# Patient Record
Sex: Female | Born: 1990 | Race: White | Hispanic: No | Marital: Married | Smoking: Never smoker
Health system: Southern US, Community
[De-identification: ages and names within clinical notes are randomized; demographics above are authoritative.]

## PROBLEM LIST (undated history)

## (undated) DIAGNOSIS — R87629 Unspecified abnormal cytological findings in specimens from vagina: Secondary | ICD-10-CM

## (undated) DIAGNOSIS — B009 Herpesviral infection, unspecified: Secondary | ICD-10-CM

## (undated) DIAGNOSIS — Z789 Other specified health status: Secondary | ICD-10-CM

## (undated) DIAGNOSIS — F909 Attention-deficit hyperactivity disorder, unspecified type: Secondary | ICD-10-CM

## (undated) HISTORY — DX: Attention-deficit hyperactivity disorder, unspecified type: F90.9

## (undated) HISTORY — DX: Unspecified abnormal cytological findings in specimens from vagina: R87.629

## (undated) HISTORY — PX: WISDOM TOOTH EXTRACTION: SHX21

## (undated) HISTORY — DX: Herpesviral infection, unspecified: B00.9

---

## 2014-02-28 DIAGNOSIS — F902 Attention-deficit hyperactivity disorder, combined type: Secondary | ICD-10-CM | POA: Insufficient documentation

## 2015-03-14 ENCOUNTER — Encounter (HOSPITAL_COMMUNITY): Payer: Self-pay | Admitting: Obstetrics and Gynecology

## 2015-03-14 ENCOUNTER — Other Ambulatory Visit (HOSPITAL_COMMUNITY): Payer: Self-pay | Admitting: Obstetrics and Gynecology

## 2015-03-14 DIAGNOSIS — Z3A18 18 weeks gestation of pregnancy: Secondary | ICD-10-CM

## 2015-03-14 DIAGNOSIS — Z3689 Encounter for other specified antenatal screening: Secondary | ICD-10-CM

## 2015-03-14 DIAGNOSIS — O358XX0 Maternal care for other (suspected) fetal abnormality and damage, not applicable or unspecified: Secondary | ICD-10-CM

## 2015-03-17 ENCOUNTER — Encounter (HOSPITAL_COMMUNITY): Payer: Self-pay | Admitting: Obstetrics and Gynecology

## 2015-03-28 ENCOUNTER — Ambulatory Visit (HOSPITAL_COMMUNITY): Admission: RE | Admit: 2015-03-28 | Payer: BLUE CROSS/BLUE SHIELD | Source: Ambulatory Visit

## 2015-03-28 ENCOUNTER — Encounter (HOSPITAL_COMMUNITY): Payer: BLUE CROSS/BLUE SHIELD

## 2015-03-31 ENCOUNTER — Ambulatory Visit (HOSPITAL_COMMUNITY): Admission: RE | Admit: 2015-03-31 | Payer: BLUE CROSS/BLUE SHIELD | Source: Ambulatory Visit

## 2015-03-31 ENCOUNTER — Ambulatory Visit (HOSPITAL_COMMUNITY)
Admission: RE | Admit: 2015-03-31 | Discharge: 2015-03-31 | Disposition: A | Discharge status: Home / Self Care | Payer: BLUE CROSS/BLUE SHIELD | Source: Ambulatory Visit | Attending: Obstetrics and Gynecology | Admitting: Obstetrics and Gynecology

## 2015-03-31 ENCOUNTER — Encounter (HOSPITAL_COMMUNITY): Payer: Self-pay

## 2015-03-31 DIAGNOSIS — O358XX0 Maternal care for other (suspected) fetal abnormality and damage, not applicable or unspecified: Secondary | ICD-10-CM | POA: Insufficient documentation

## 2015-03-31 DIAGNOSIS — Z36 Encounter for antenatal screening of mother: Secondary | ICD-10-CM | POA: Insufficient documentation

## 2015-03-31 DIAGNOSIS — Z3689 Encounter for other specified antenatal screening: Secondary | ICD-10-CM

## 2015-03-31 DIAGNOSIS — Z3A18 18 weeks gestation of pregnancy: Secondary | ICD-10-CM

## 2015-03-31 HISTORY — DX: Other specified health status: Z78.9

## 2015-04-07 ENCOUNTER — Encounter (HOSPITAL_COMMUNITY): Payer: Self-pay

## 2015-04-07 ENCOUNTER — Other Ambulatory Visit (HOSPITAL_COMMUNITY): Payer: Self-pay

## 2015-08-21 DIAGNOSIS — Q793 Gastroschisis: Secondary | ICD-10-CM

## 2016-02-03 ENCOUNTER — Encounter (HOSPITAL_COMMUNITY): Payer: Self-pay

## 2017-03-03 IMAGING — US US MFM OB DETAIL+14 WK
1 series · 14 of 28 positions shown · non-contrast
Comparison: none

[Series 1: us mfm ob detail+14 wk · 116 acquisitions, 14 frames shown]
[im 5/116]
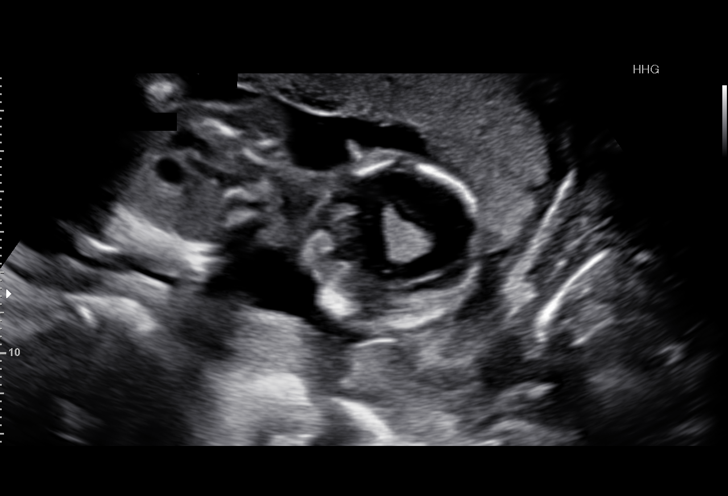
[im 13/116]
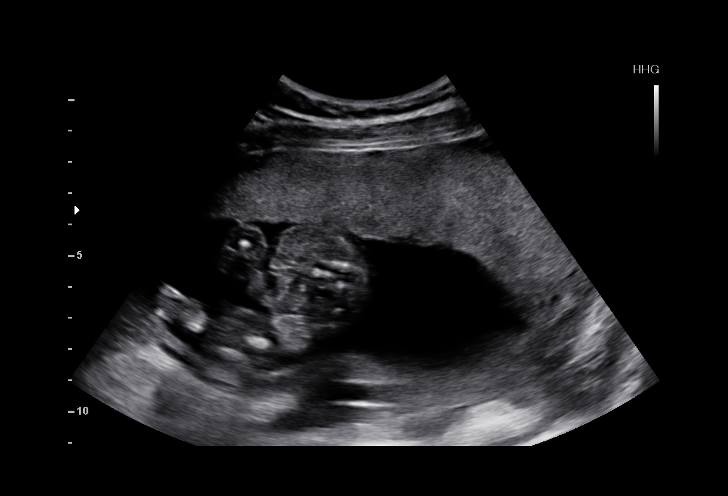
[im 22/116]
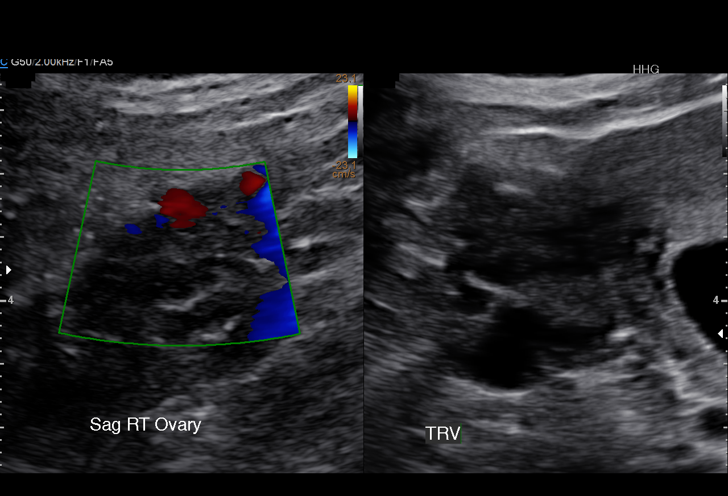
[im 30/116]
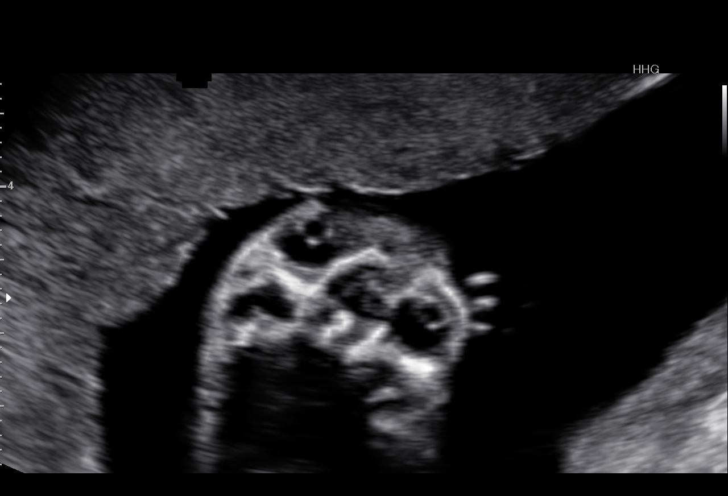
[im 39/116]
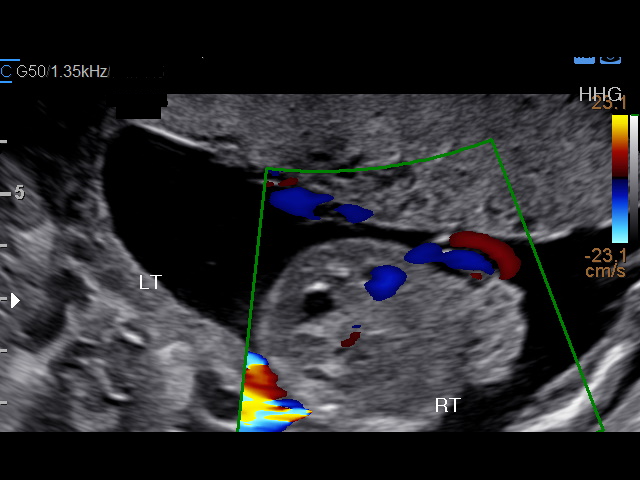
[im 47/116]
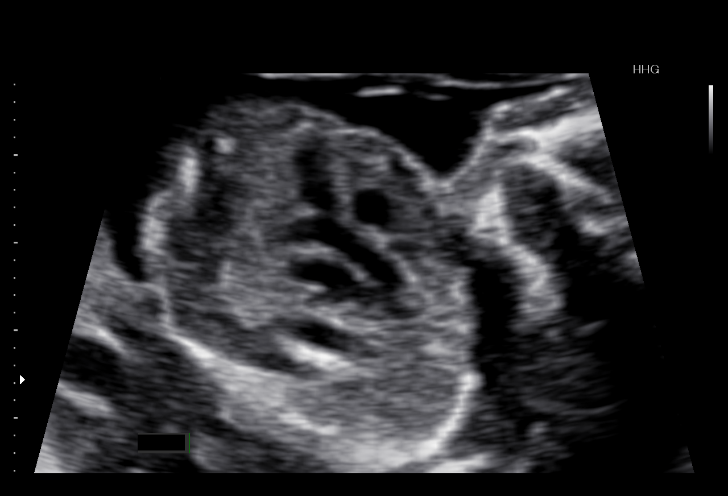
[im 56/116]
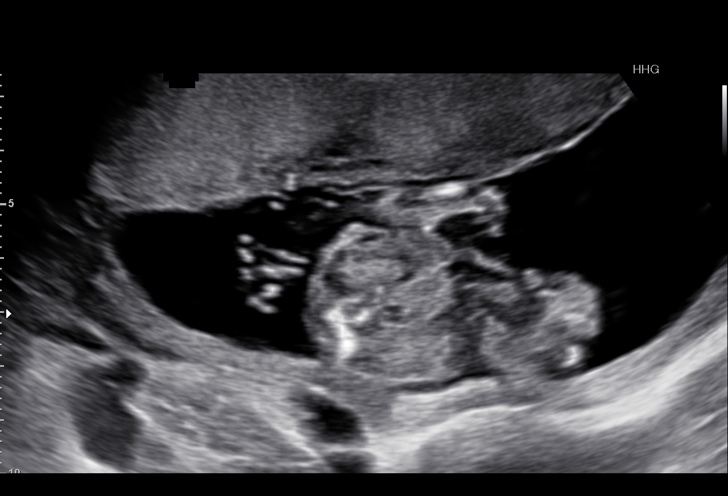
[im 64/116]
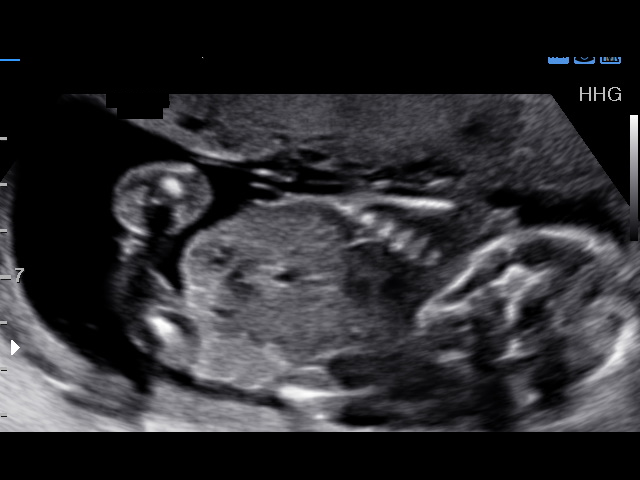
[im 73/116]
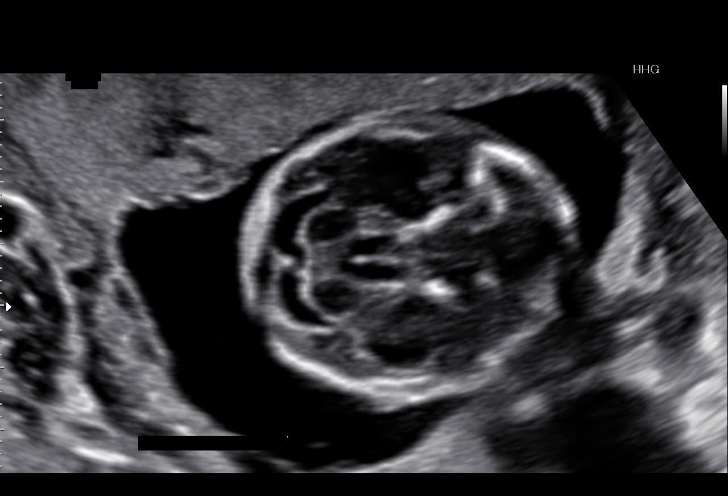
[im 81/116]
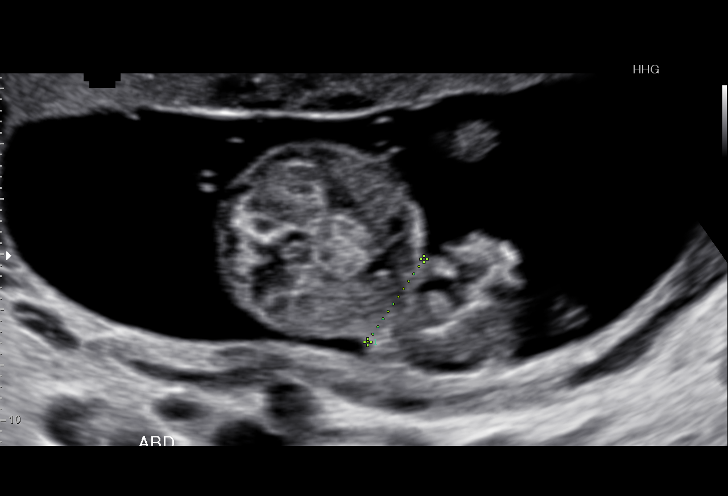
[im 90/116]
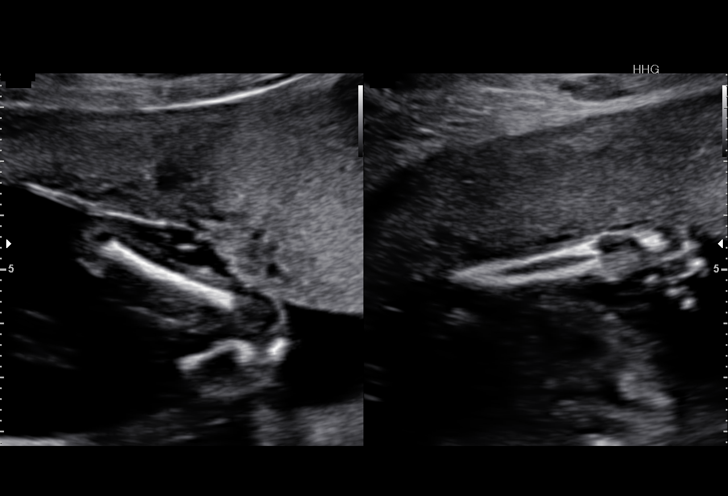
[im 98/116]
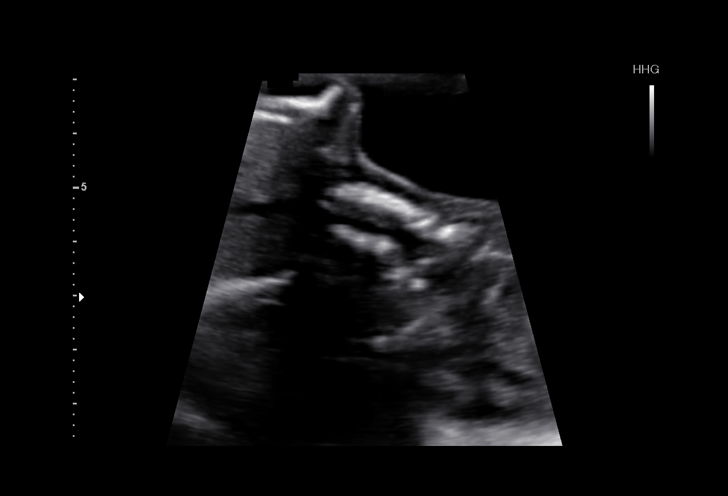
[im 107/116]
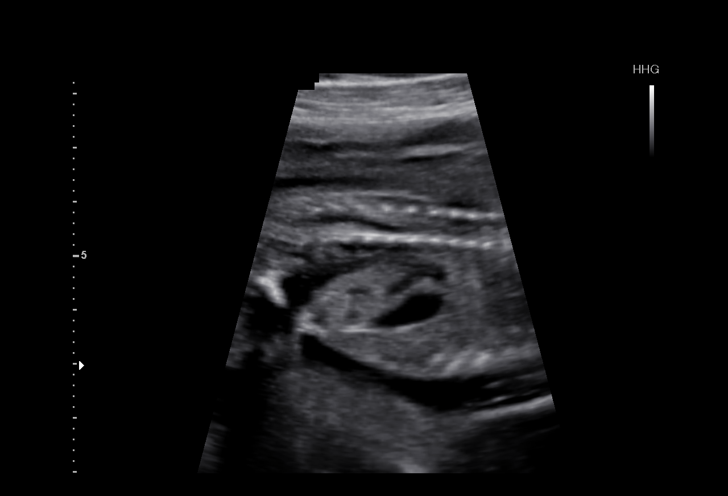
[im 116/116]
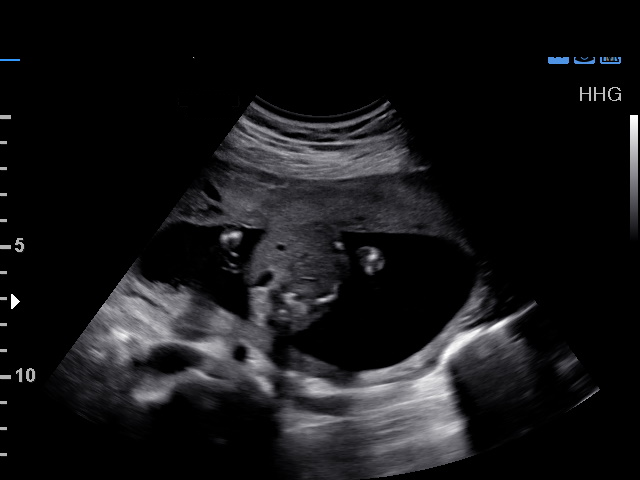

[14 of 28 positions shown; findings below may reference images not displayed]

[[facility]

1  DASHAWN SCHMID           [id number]      [id number]     [id number]
Indications

Detailed fetal anatomic survey                 Z36
Fetal abnormality - other known or
suspected (gastroschisis)
18 weeks gestation of pregnancy
OB History

Gravidity:    1
Fetal Evaluation

Num Of Fetuses:     1
Fetal Heart         136
Rate(bpm):
Cardiac Activity:   Observed
Presentation:       Cephalic
Placenta:           Anterior, above cervical os
P. Cord Insertion:  Visualized, central

Amniotic Fluid
AFI FV:      Subjectively within normal limits
Larg Pckt:   4.61  cm
Biometry

BPD:        43  mm     G. Age:  19w 0d                  CI:         73.07  %    70 - 86
FL/HC:       18.3  %    16.1 -
HC:      159.9  mm     G. Age:  18w 6d         41  %    HC/AC:       1.19       1.09 -
AC:      134.7  mm     G. Age:  19w 0d         49  %    FL/BPD:      67.9  %
FL:       29.2  mm     G. Age:  19w 0d         48  %    FL/AC:       21.7  %    20 - 24
HUM:        27  mm     G. Age:  18w 4d         44  %
CER:      18.5  mm     G. Age:  18w 2d         31  %
NFT:       3.4  mm
LV:        6.9  mm
CM:        3.7  mm

Est. FW:     266   gm     0 lb 9 oz     47  %
Gestational Age

LMP:           20w 4d        Date:  11/07/14                 EDD:    08/14/15
U/S Today:     19w 0d                                        EDD:    08/25/15
Best:          18w 6d     Det. By:  Previous Ultrasound      EDD:    08/26/15
Anatomy

Cranium:          Appears normal         Aortic Arch:      Appears normal
Fetal Cavum:      Appears normal         Ductal Arch:      Appears normal
Ventricles:       Appears normal         Diaphragm:        Appears normal
Choroid Plexus:   Appears normal         Stomach:          Appears normal, left
sided
Cerebellum:       Appears normal         Abdomen:          Appears normal
Posterior Fossa:  Appears normal         Abdominal Wall:   Gastroschisis
Nuchal Fold:      Appears normal         Cord Vessels:     Appears normal (3
vessel cord)
Face:             Appears normal         Kidneys:          Appear normal
(orbits and profile)
Lips:             Appears normal         Bladder:          Appears normal
Fetal Thoracic:   Appears normal         Spine:            Appears normal
Heart:            Appears normal         Upper             Appears normal
(4CH, axis, and situs  Extremities:
RVOT:             Appears normal         Lower             Appears normal
Extremities:
LVOT:             Appears normal

Other:  Fetus appears to be a female. Nasal bone visualized. Technically
difficult due to fetal position.
Cervix Uterus Adnexa

Cervix
Length:           3.02  cm.
Normal appearance by transabdominal scan.

Uterus
No abnormality visualized.

Left Ovary
Not visualized.

Right Ovary
Size(cm)     2.13   x   1.98   x  1.52      Vol(ml):
Within normal limits.

Adnexa:       No adnexal mass visualized.
Impression

SIUP at 18+6 weeks
Gastroschisis
All other detailed fetal anatomy was seen and appeared
normal
Markers of aneuploidy: none
Normal amniotic fluid volume
Measurements consistent with prior US

The US findings were shared with Ms. Jumper. The
implications of gastroschisis were discussed in detail. Ms.
Jumper indicated that she wanted to deliver at [facility] to be
near her family. I will contact [facility] perinatal coordinator
who can arrange future appointments.
Recommendations

Serial [employer] for growth
Vaginal delivery by 39 weeks depending on clinical course
Prenatal consultation with pediatric general surgeon

## 2020-11-25 ENCOUNTER — Ambulatory Visit: Payer: Self-pay

## 2020-12-20 ENCOUNTER — Other Ambulatory Visit: Payer: Self-pay | Admitting: Obstetrics & Gynecology

## 2020-12-20 DIAGNOSIS — O3680X0 Pregnancy with inconclusive fetal viability, not applicable or unspecified: Secondary | ICD-10-CM

## 2020-12-26 ENCOUNTER — Other Ambulatory Visit: Payer: Self-pay

## 2020-12-26 ENCOUNTER — Ambulatory Visit (INDEPENDENT_AMBULATORY_CARE_PROVIDER_SITE_OTHER): Payer: No Typology Code available for payment source

## 2020-12-26 ENCOUNTER — Other Ambulatory Visit: Payer: Self-pay | Admitting: Obstetrics & Gynecology

## 2020-12-26 DIAGNOSIS — Z3A09 9 weeks gestation of pregnancy: Secondary | ICD-10-CM

## 2020-12-26 DIAGNOSIS — O3680X0 Pregnancy with inconclusive fetal viability, not applicable or unspecified: Secondary | ICD-10-CM

## 2020-12-26 NOTE — Progress Notes (Signed)
[place] 9+3 wks,DC/DA twins,normal ovaries Baby A: left,anterior placenta,CRL 28.48 mm,FHR 167 bpm Baby B: right,posterior placenta,CRL 28.27 mm,FHR 158 bpm

## 2021-01-24 ENCOUNTER — Other Ambulatory Visit: Payer: Self-pay | Admitting: Obstetrics & Gynecology

## 2021-01-24 DIAGNOSIS — Z3682 Encounter for antenatal screening for nuchal translucency: Secondary | ICD-10-CM

## 2021-01-24 DIAGNOSIS — Z3689 Encounter for other specified antenatal screening: Secondary | ICD-10-CM

## 2021-01-25 ENCOUNTER — Ambulatory Visit: Payer: No Typology Code available for payment source | Admitting: *Deleted

## 2021-01-25 ENCOUNTER — Encounter: Payer: Self-pay | Admitting: Advanced Practice Midwife

## 2021-01-25 ENCOUNTER — Other Ambulatory Visit: Payer: Self-pay

## 2021-01-25 ENCOUNTER — Other Ambulatory Visit: Payer: Self-pay | Admitting: Advanced Practice Midwife

## 2021-01-25 ENCOUNTER — Ambulatory Visit (INDEPENDENT_AMBULATORY_CARE_PROVIDER_SITE_OTHER): Payer: No Typology Code available for payment source

## 2021-01-25 ENCOUNTER — Ambulatory Visit (INDEPENDENT_AMBULATORY_CARE_PROVIDER_SITE_OTHER): Payer: No Typology Code available for payment source | Admitting: Advanced Practice Midwife

## 2021-01-25 VITALS — BP 113/67 | HR 72 | Ht 67.0 in | Wt 135.0 lb

## 2021-01-25 DIAGNOSIS — O30041 Twin pregnancy, dichorionic/diamniotic, first trimester: Secondary | ICD-10-CM

## 2021-01-25 DIAGNOSIS — N83202 Unspecified ovarian cyst, left side: Secondary | ICD-10-CM

## 2021-01-25 DIAGNOSIS — Z3682 Encounter for antenatal screening for nuchal translucency: Secondary | ICD-10-CM | POA: Diagnosis not present

## 2021-01-25 DIAGNOSIS — N83201 Unspecified ovarian cyst, right side: Secondary | ICD-10-CM | POA: Diagnosis not present

## 2021-01-25 DIAGNOSIS — O30049 Twin pregnancy, dichorionic/diamniotic, unspecified trimester: Secondary | ICD-10-CM | POA: Insufficient documentation

## 2021-01-25 DIAGNOSIS — O0991 Supervision of high risk pregnancy, unspecified, first trimester: Secondary | ICD-10-CM

## 2021-01-25 DIAGNOSIS — O0992 Supervision of high risk pregnancy, unspecified, second trimester: Secondary | ICD-10-CM

## 2021-01-25 DIAGNOSIS — Z6791 Unspecified blood type, Rh negative: Secondary | ICD-10-CM

## 2021-01-25 DIAGNOSIS — Z363 Encounter for antenatal screening for malformations: Secondary | ICD-10-CM

## 2021-01-25 DIAGNOSIS — Z3689 Encounter for other specified antenatal screening: Secondary | ICD-10-CM

## 2021-01-25 DIAGNOSIS — O30009 Twin pregnancy, unspecified number of placenta and unspecified number of amniotic sacs, unspecified trimester: Secondary | ICD-10-CM

## 2021-01-25 DIAGNOSIS — O26899 Other specified pregnancy related conditions, unspecified trimester: Secondary | ICD-10-CM | POA: Diagnosis not present

## 2021-01-25 DIAGNOSIS — Z3A13 13 weeks gestation of pregnancy: Secondary | ICD-10-CM | POA: Diagnosis not present

## 2021-01-25 DIAGNOSIS — O0990 Supervision of high risk pregnancy, unspecified, unspecified trimester: Secondary | ICD-10-CM | POA: Insufficient documentation

## 2021-01-25 LAB — POCT URINALYSIS DIPSTICK OB
Blood, UA: NEGATIVE
Glucose, UA: NEGATIVE
Ketones, UA: NEGATIVE
Leukocytes, UA: NEGATIVE
Nitrite, UA: NEGATIVE
POC,PROTEIN,UA: NEGATIVE

## 2021-01-25 NOTE — Patient Instructions (Addendum)
Tami Velasquez, I greatly value your feedback.  If you receive a survey following your visit with [place] today, we appreciate you taking the time to fill it out.  Thanks, Cathie Beams, DNP, CNM   [phone] is the phone number for Pregnancy Classes or hospital tours at [facility].   You will be referred to  [email or web address]   for more information on childbirth classes   At this site you may register for classes. You may sign up for a waiting list if classes are full. Please SIGN UP FOR THIS!.   When the waiting list becomes long, sometimes new classes can be added.  [facility] Call to Register: [phone] or [phone]   or   Register Online: [email or web address] THESE CLASSES FILL UP VERY QUICKLY, SO SIGN UP AS SOON AS YOU CAN!!! Please visit [facility] pregnancy website at [email or web address]  Childbirth Classes  Option 1: Birth & Baby Series Series of 3 weekly classes, on the same day of the week (can choose Mon-Thurs) from 6-9pm Helps you and your support person prepare for childbirth Reviews newborn care, labor & birth, cesarean birth, pain management, and comfort techniques Cost: $60 per couple for insured or self-pay, $30 per couple for Medicaid  Option 2: Weekend Birth & Baby This class is a weekend version of our Birth & Baby series.  It is designed for parents who have a difficult time fitting several weeks of classes into their schedule.   Covers the care of your newborn and the basics of labor and childbirth Friday 6:30pm-8:30pm Saturday 9am-4pm, includes lunch for you and your partner  Cost: $75 per couple for insured or self-pay, $30 per couple for Medicaid  Option 3: Natural Childbirth This series of 5 weekly classes is for expectant parents who want to learn and practice natural methods of coping with the process of labor  and childbirth.  Can choose Mon or Tues, 7-9pm.   Covers relaxation, breathing, massage, visualization, role of the partner, and helpful positioning Participants learn how to be confident in their body's ability to give birth. Class empowers and helps parents make informed decisions about care. Includes discussion that will help new parents transition into the immediate postpartum period.  Cost: $75 per couple for insured or self-pay, $30 per couple for Medicaid  Option 4: Online Birth & Baby This online class offers you the freedom to complete a Birth & Baby series in the comfort of your own home.  The flexibility of this option allows you to review sections at your own pace, at times convenient to you and your support people.  It includes additional video information, animations, quizzes and extended activities. Get organized with helpful eClass tools, checklists, and trackers.  Cost: $60 for 60 days of online access                                                                            Other Available Classes  Baby & Me Enjoy this time to discuss newborn & infant parenting topics and family adjustment issues with other new mothers in a relaxed environment. Each week brings a new speaker or baby-centered activity. We encourage mothers and their babies (birth to  crawling) to join [place]. You are welcome to visit this group even if you haven't delivered yet! It's wonderful to make new friends early and watch other moms interact with their babies. No registration or fee.  Big Brother/Big Sister Let your children share in the joy of a new brother or sister in this special class designed just for them. Discussion includes how families care for babies: swaddling, holding, diapering, safety, as well as how they can be helpful in their new role. This class is designed for children ages 2 to 25, but any age is welcome. Please register each child individually. $5 Breastfeeding Support Group This group is a  mother-to-mother support circle where moms have the opportunity to share their breastfeeding experiences. A Breastfeeding Support nurse is present for questions and concerns. An infant scale is available for weight checks. No fee or registration.  Breastfeeding Your Baby Breastfeeding is a special time for mother and child. This class will help you feel ready to begin this important relationship. Your partner is encouraged to attend with you. Learn what to expect and feel more confident in the first days of breastfeeding your newborn. This class also addresses the most common fears and challenges of breastfeeding during the first few weeks, months, and beyond. $30 per couple Caring for Baby This class is for expectant and adoptive parents who want to learn and practice the most up-to-date newborn care for their babies. Focus is on birth through first six weeks of life. Topics include feeding, bathing, diapering, crying, umbilical cord care, circumcision care and safe sleep. Parents learn how to recognize symptoms of illness and when to call the pediatrician. Register only the mom-to-be and your partner can plan to come with you. (*Note: This class is included in the Birth & Baby series and the Weekend Birth & Baby classes.) $10 per couple Comfort Techniques & Tour This 2-hour interactive class is designed for those who either do not wish to take the Birth & Baby series or for those who prefer our online childbirth class, but don't want to miss the opportunity to learn and practice hands-on techniques. These skills can help relieve some of the discomfort of labor and encourage your baby to rotate toward the best position for birth. You and your partner will be able to try a variety of labor positions with birth balls and rebozos as well as practice breathing, relaxation, and visual techniques. $20 per couple [employer] This course offers Dads-to-be the tools and knowledge needed to feel confident on  their journey to becoming new fathers. Experienced dads, who have been trained as coaches, teach dads-to-be how to hold, comfort, diapers, swaddle and play with their infant while being able to support the new mom as well. $25 Grandparent Love Expecting a grandbaby? Learn about the latest infant care and safety recommendations and ways to support your own child as he or she transitions into the parenting role. $10 per person Infant and Child CPR Parents, grandparents, babysitters, and friends learn Cardio-Pulmonary Resuscitation skills for infants and children. You will also learn how to treat both conscious and unconscious choking infants and children. Register each participant individually. (Note: This Family & Friends program does not offer certification.) $20 per person Marvelous Multiples Expecting twins, triplets, or more? This free 2-hour class covers the differences in labor, birth, parenting, and breastfeeding issues that face multiples' parents.  Maternity Care Center Virtual Tour  Online virtual tour of the [place] [facility] [place]  [facility]  Mom Talk This free mom-led group offers support and connection to mothers as they journey through the adjustments and struggles of that sometimes overwhelming first year after the birth of a child. A member of our staff will be present to share resources and additional support if needed, as you care for yourself and baby. You are welcome to visit this group before you deliver! It's wonderful to meet new friends early and watch other moms interact with their babies.  Waterbirth Class Interested in a waterbirth? This free informational class will help you discover whether waterbirth is the right fit for you and is required if you are planning a waterbirth. Education about waterbirth itself, supplies you may need, and what you may need from your support team is included in this class. Partners are encouraged to come.     [facility] HAS MOVED!!! It is now [facility] ([address] [place], [place] [address]) Entrance located off of [address] Free 24/7 valet parking   Nausea & Vomiting Have saltine crackers or pretzels by your bed and eat a few bites before you raise your head out of bed in the morning Eat small frequent meals throughout the day instead of large meals Drink plenty of fluids throughout the day to stay hydrated, just don't drink a lot of fluids with your meals.  This can make your stomach fill up faster making you feel sick Do not brush your teeth right after you eat Products with real ginger are good for nausea, like ginger ale and ginger hard candy Make sure it says made with real ginger! Sucking on sour candy like lemon heads is also good for nausea If your prenatal vitamins make you nauseated, take them at night so you will sleep through the nausea Sea Bands If you feel like you need medicine for the nausea & vomiting please let us know If you are unable to keep any fluids or food down please let us know   Constipation Drink plenty of fluid, preferably water, throughout the day Eat foods high in fiber such as fruits, vegetables, and grains Exercise, such as walking, is a good way to keep your bowels regular Drink warm fluids, especially warm prune juice, or decaf coffee Eat a 1/2 cup of real oatmeal (not instant), 1/2 cup applesauce, and 1/2-1 cup warm prune juice every day If needed, you may take Colace (docusate sodium) stool softener once or twice a day to help keep the stool soft.  If you still are having problems with constipation, you may take Miralax once daily as needed to help keep your bowels regular.   Home Blood Pressure Monitoring for Patients   Your provider has recommended that you check your blood pressure (BP) at least once a week at home. If you do not have a blood pressure cuff at home, one will be provided for you. Contact your provider  if you have not received your monitor within 1 week.   Helpful Tips for Accurate Home Blood Pressure Checks  Don't smoke, exercise, or drink caffeine 30 minutes before checking your BP Use the restroom before checking your BP (a full bladder can raise your pressure) Relax in a comfortable upright chair Feet on the ground Left arm resting comfortably on a flat surface at the level of your heart Legs uncrossed Back supported Sit quietly and don't talk Place the cuff on your bare arm Adjust snuggly, so that only two fingertips can  fit between your skin and the top of the cuff Check 2 readings separated by at least one minute Keep a log of your BP readings For a visual, please reference this diagram: http://ccnc.care/bpdiagram  Provider Name: [facility]     Phone: [phone]  Zone 1: ALL CLEAR  Continue to monitor your symptoms:  BP reading is less than 140 (top number) or less than 90 (bottom number)  No right upper stomach pain No headaches or seeing spots No feeling nauseated or throwing up No swelling in face and hands  Zone 2: CAUTION Call your doctor's office for any of the following:  BP reading is greater than 140 (top number) or greater than 90 (bottom number)  Stomach pain under your ribs in the middle or right side Headaches or seeing spots Feeling nauseated or throwing up Swelling in face and hands  Zone 3: EMERGENCY  Seek immediate medical care if you have any of the following:  BP reading is greater than160 (top number) or greater than 110 (bottom number) Severe headaches not improving with Tylenol Serious difficulty catching your breath Any worsening symptoms from Zone 2    First Trimester of Pregnancy The first trimester of pregnancy is from week 1 until the end of week 12 (months 1 through 3). A week after a sperm fertilizes an egg, the egg will implant on the wall of the uterus. This embryo will begin to develop into a baby. Genes from you and your  partner are forming the baby. The female genes determine whether the baby is a boy or a girl. At 6-8 weeks, the eyes and face are formed, and the heartbeat can be seen on ultrasound. At the end of 12 weeks, all the baby's organs are formed.  Now that you are pregnant, you will want to do everything you can to have a healthy baby. Two of the most important things are to get good prenatal care and to follow your health care provider's instructions. Prenatal care is all the medical care you receive before the baby's birth. This care will help prevent, find, and treat any problems during the pregnancy and childbirth. BODY CHANGES Your body goes through many changes during pregnancy. The changes vary from woman to woman.  You may gain or lose a couple of pounds at first. You may feel sick to your stomach (nauseous) and throw up (vomit). If the vomiting is uncontrollable, call your health care provider. You may tire easily. You may develop headaches that can be relieved by medicines approved by your health care provider. You may urinate more often. Painful urination may mean you have a bladder infection. You may develop heartburn as a result of your pregnancy. You may develop constipation because certain hormones are causing the muscles that push waste through your intestines to slow down. You may develop hemorrhoids or swollen, bulging veins (varicose veins). Your breasts may begin to grow larger and become tender. Your nipples may stick out more, and the tissue that surrounds them (areola) may become darker. Your gums may bleed and may be sensitive to brushing and flossing. Dark spots or blotches (chloasma, mask of pregnancy) may develop on your face. This will likely fade after the baby is born. Your menstrual periods will stop. You may have a loss of appetite. You may develop cravings for certain kinds of food. You may have changes in your emotions from day to day, such as being excited to be pregnant  or being concerned that something may  go wrong with the pregnancy and baby. You may have more vivid and strange dreams. You may have changes in your hair. These can include thickening of your hair, rapid growth, and changes in texture. Some women also have hair loss during or after pregnancy, or hair that feels dry or thin. Your hair will most likely return to normal after your baby is born. WHAT TO EXPECT AT YOUR PRENATAL VISITS During a routine prenatal visit: You will be weighed to make sure you and the baby are growing normally. Your blood pressure will be taken. Your abdomen will be measured to track your baby's growth. The fetal heartbeat will be listened to starting around week 10 or 12 of your pregnancy. Test results from any previous visits will be discussed. Your health care provider may ask you: How you are feeling. If you are feeling the baby move. If you have had any abnormal symptoms, such as leaking fluid, bleeding, severe headaches, or abdominal cramping. If you have any questions. Other tests that may be performed during your first trimester include: Blood tests to find your blood type and to check for the presence of any previous infections. They will also be used to check for low iron levels (anemia) and Rh antibodies. Later in the pregnancy, blood tests for diabetes will be done along with other tests if problems develop. Urine tests to check for infections, diabetes, or protein in the urine. An ultrasound to confirm the proper growth and development of the baby. An amniocentesis to check for possible genetic problems. Fetal screens for spina bifida and Down syndrome. You may need other tests to make sure you and the baby are doing well. HOME CARE INSTRUCTIONS  Medicines Follow your health care provider's instructions regarding medicine use. Specific medicines may be either safe or unsafe to take during pregnancy. Take your prenatal vitamins as directed. If you develop  constipation, try taking a stool softener if your health care provider approves. Diet Eat regular, well-balanced meals. Choose a variety of foods, such as meat or vegetable-based protein, fish, milk and low-fat dairy products, vegetables, fruits, and whole grain breads and cereals. Your health care provider will help you determine the amount of weight gain that is right for you. Avoid raw meat and uncooked cheese. These carry germs that can cause birth defects in the baby. Eating four or five small meals rather than three large meals a day may help relieve nausea and vomiting. If you start to feel nauseous, eating a few soda crackers can be helpful. Drinking liquids between meals instead of during meals also seems to help nausea and vomiting. If you develop constipation, eat more high-fiber foods, such as fresh vegetables or fruit and whole grains. Drink enough fluids to keep your urine clear or pale yellow. Activity and Exercise Exercise only as directed by your health care provider. Exercising will help you: Control your weight. Stay in shape. Be prepared for labor and delivery. Experiencing pain or cramping in the lower abdomen or low back is a good sign that you should stop exercising. Check with your health care provider before continuing normal exercises. Try to avoid standing for long periods of time. Move your legs often if you must stand in one place for a long time. Avoid heavy lifting. Wear low-heeled shoes, and practice good posture. You may continue to have sex unless your health care provider directs you otherwise. Relief of Pain or Discomfort Wear a good support bra for breast tenderness.   Take warm  sitz baths to soothe any pain or discomfort caused by hemorrhoids. Use hemorrhoid cream if your health care provider approves.   Rest with your legs elevated if you have leg cramps or low back pain. If you develop varicose veins in your legs, wear support hose. Elevate your feet for 15  minutes, 3-4 times a day. Limit salt in your diet. Prenatal Care Schedule your prenatal visits by the twelfth week of pregnancy. They are usually scheduled monthly at first, then more often in the last 2 months before delivery. Write down your questions. Take them to your prenatal visits. Keep all your prenatal visits as directed by your health care provider. Safety Wear your seat belt at all times when driving. Make a list of emergency phone numbers, including numbers for family, friends, the hospital, and police and fire departments. General Tips Ask your health care provider for a referral to a local prenatal education class. Begin classes no later than at the beginning of month 6 of your pregnancy. Ask for help if you have counseling or nutritional needs during pregnancy. Your health care provider can offer advice or refer you to specialists for help with various needs. Do not use hot tubs, steam rooms, or saunas. Do not douche or use tampons or scented sanitary pads. Do not cross your legs for long periods of time. Avoid cat litter boxes and soil used by cats. These carry germs that can cause birth defects in the baby and possibly loss of the fetus by miscarriage or stillbirth. Avoid all smoking, herbs, alcohol, and medicines not prescribed by your health care provider. Chemicals in these affect the formation and growth of the baby. Schedule a dentist appointment. At home, brush your teeth with a soft toothbrush and be gentle when you floss. SEEK MEDICAL CARE IF:  You have dizziness. You have mild pelvic cramps, pelvic pressure, or nagging pain in the abdominal area. You have persistent nausea, vomiting, or diarrhea. You have a bad smelling vaginal discharge. You have pain with urination. You notice increased swelling in your face, hands, legs, or ankles. SEEK IMMEDIATE MEDICAL CARE IF:  You have a fever. You are leaking fluid from your vagina. You have spotting or bleeding from your  vagina. You have severe abdominal cramping or pain. You have rapid weight gain or loss. You vomit blood or material that looks like coffee grounds. You are exposed to [place] measles and have never had them. You are exposed to fifth disease or chickenpox. You develop a severe headache. You have shortness of breath. You have any kind of trauma, such as from a fall or a car accident. Document Released: 01/08/2001 Document Revised: 05/31/2013 Document Reviewed: 11/24/2012 [facility] Patient Information 2015 [place], [place]. This information is not intended to replace advice given to you by your health care provider. Make sure you discuss any questions you have with your health care provider.  Coronavirus (COVID-19) Are you at risk?  Are you at risk for the Coronavirus (COVID-19)?  To be considered HIGH RISK for Coronavirus (COVID-19), you have to meet the following criteria:  Traveled to [place], [place], [place], [place] or [place];  and have fever, cough, and shortness of breath within the last 2 weeks of travel OR Been in close contact with a person diagnosed with COVID-19 within the last 2 weeks and have fever, cough, and shortness of breath IF YOU DO NOT MEET THESE CRITERIA, YOU ARE CONSIDERED LOW RISK FOR COVID-19.  What to do if you are HIGH RISK  for COVID-19?  If you are having a medical emergency, call 911. Seek medical care right away. Before you go to a doctors office, urgent care or emergency department, call ahead and tell them about your recent travel, contact with someone diagnosed with COVID-19, and your symptoms. You should receive instructions from your physicians office regarding next steps of care.  When you arrive at healthcare provider, tell the healthcare staff immediately you have returned from visiting [place], [place], [place], [place] or [place]; in the last two weeks or you have been in close contact with a person diagnosed with COVID-19 in the last 2 weeks.   Tell the health  care staff about your symptoms: fever, cough and shortness of breath. After you have been seen by a medical provider, you will be either: Tested for (COVID-19) and discharged home on quarantine except to seek medical care if symptoms worsen, and asked to  Stay home and avoid contact with others until you get your results (4-5 days)  Avoid travel on public transportation if possible (such as bus, train, or airplane) or Sent to the Emergency Department by EMS for evaluation, COVID-19 testing, and possible admission depending on your condition and test results.  What to do if you are LOW RISK for COVID-19?  Reduce your risk of any infection by using the same precautions used for avoiding the common cold or flu:  Wash your hands often with soap and warm water for at least 20 seconds.  If soap and water are not readily available, use an alcohol-based hand sanitizer with at least 60% alcohol.  If coughing or sneezing, cover your mouth and nose by coughing or sneezing into the elbow areas of your shirt or coat, into a tissue or into your sleeve (not your hands). Avoid shaking hands with others and consider head nods or verbal greetings only. Avoid touching your eyes, nose, or mouth with unwashed hands.  Avoid close contact with people who are sick. Avoid places or events with large numbers of people in one location, like concerts or sporting events. Carefully consider travel plans you have or are making. If you are planning any travel outside or inside the [place], visit the [facility] webpage for the latest health notices. If you have some symptoms but not all symptoms, continue to monitor at home and seek medical attention if your symptoms worsen. If you are having a medical emergency, call 911.   ADDITIONAL HEALTHCARE OPTIONS FOR PATIENTS  [facility] / e-Visit: [email or web address]         [facility]: [phone]  [facility]: [phone]                   [facility]: [phone]     Safe Medications in Pregnancy   Acne: Benzoyl Peroxide Salicylic Acid  Backache/Headache: Tylenol: 2 regular strength every 4 hours OR              2 Extra strength every 6 hours  Colds/Coughs/Allergies: Benadryl (alcohol free) 25 mg every 6 hours as needed Breath right strips Claritin Cepacol throat lozenges Chloraseptic throat spray Cold-Eeze- up to three times per day Cough drops, alcohol free Flonase (by prescription only) Guaifenesin Mucinex Robitussin DM (plain only, alcohol free) Saline nasal spray/drops Sudafed (pseudoephedrine) & Actifed ** use only after [redacted] weeks gestation and if you do not have high blood pressure Tylenol Vicks Vaporub Zinc lozenges Zyrtec   Constipation: Colace Ducolax suppositories Fleet enema Glycerin  suppositories Metamucil Milk of magnesia Miralax Senokot Smooth move tea  Diarrhea: Kaopectate Imodium A-D  *NO pepto Bismol  Hemorrhoids: Anusol Anusol HC Preparation H Tucks  Indigestion: Tums Maalox Mylanta Zantac  Pepcid  Insomnia: Benadryl (alcohol free)  every 6 hours as needed Tylenol PM Unisom, no Gelcaps  Leg Cramps: Tums MagGel  Nausea/Vomiting:  Bonine Dramamine Emetrol Ginger extract Sea bands Meclizine  Nausea medication to take during pregnancy:  Unisom (doxylamine succinate 25 mg tablets) Take one tablet daily at bedtime. If symptoms are not adequately controlled, the dose can be increased to a maximum recommended dose of two tablets daily (1/2 tablet in the morning, 1/2 tablet mid-afternoon and one at bedtime). Vitamin B6  tablets. Take one tablet twice a day (up to 200 mg per day).  Skin Rashes: Aveeno products Benadryl cream or  every 6 hours as needed Calamine Lotion 1% cortisone cream  Yeast infection: Gyne-lotrimin 7 Monistat 7   **If taking multiple medications,  please check labels to avoid duplicating the same active ingredients **take medication as directed on the label ** Do not exceed 4000 mg of tylenol in 24 hours **Do not take medications that contain aspirin or ibuprofen

## 2021-01-25 NOTE — Progress Notes (Signed)
DC/DA TWINS 13+5 WKS,bilat enlarged ovaries with multiple simple cysts,largest cysts: right ovary (#1) 5 x 4.6 x 3.9 cm,(#2) 3.6 x 2.5 x 3.4 cm(#3) 3.7 x 2.4 x 3.3 cm,left ovarian cysts (#1) 6.6 x 3.3 x 5.7 cm,(#2) 6.5 x 6.6 x 3.1 cm,(#3) 3.7 x 2.4 x 2.8 cm,bilateral ovarian arterial and venous flow visualized  BABY A: cephalic inferior right,posterior placenta,measurements c/w dates,CRL 82.21 mm,NT 1.5 mm,NB present,FHR 140 bpm, BABY B:breech superior left,anterior placenta, measurements c/w dates,CRL 84.65 mm,NT 1.6 mm.NB present, FHT 147 bpm

## 2021-01-25 NOTE — Progress Notes (Signed)
INITIAL OBSTETRICAL VISIT Patient name: Tami Velasquez MRN [id number]  Date of birth: July 02, 1990 Chief Complaint:   Initial Prenatal Visit  History of Present Illness:   Tami Velasquez is a 30 y.o. G38P2002 Caucasian female at [redacted]w[redacted]d by LMP c/w u/s at 8 weeks with an Estimated Date of Delivery: 07/28/21 being seen today for her initial obstetrical visit.   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  This pg DC/DA twins .   Today she reports no complaints  Feels well..  Depression screen [facility] 2/9 01/25/2021  Decreased Interest 1  Down, Depressed, Hopeless 0  PHQ - 2 Score 1  Altered sleeping 1  Tired, decreased energy 1  Change in appetite 1  Feeling bad or failure about yourself  0  Trouble concentrating 2  Moving slowly or fidgety/restless 2  Suicidal thoughts 0  PHQ-9 Score 8    Patient's last menstrual period was 10/21/2020 (approximate). Last pap 10/27/20 [facility]. Results were: normal Review of Systems:   Pertinent items are noted in HPI Denies cramping/contractions, leakage of fluid, vaginal bleeding, abnormal vaginal discharge w/ itching/odor/irritation, headaches, visual changes, shortness of breath, chest pain, abdominal pain, severe nausea/vomiting, or problems with urination or bowel movements unless otherwise stated above.  Pertinent History Reviewed:  Reviewed past medical,surgical, social, obstetrical and family history.  Reviewed problem list, medications and allergies. OB History  Gravida Para Term Preterm AB Living  3 2 2  0 0 2  SAB IAB Ectopic Multiple Live Births  0 0 0 0 2    # Outcome Date GA Lbr Len/2nd Weight Sex Delivery Anes PTL Lv  3 Current           2 Term 12/12/18 [redacted]w[redacted]d  8 lb (3.629 kg) M Vag-Vacuum EPI N LIV  1 Term 08/21/15 [redacted]w[redacted]d  7 lb 2.1 oz (3.235 kg) F Vag-Spont EPI N LIV     Complications: Gastroschisis   Physical Assessment:   Vitals:   01/25/21 0940 01/25/21 1001  BP: 113/67   Pulse: 72   Weight:  135 lb (61.2 kg)   Height:  5\' 7"  (1.702 m)  Body mass index is 21.14 kg/m.       Physical Examination:  General appearance - well appearing, and in no distress  Mental status - alert, oriented to person, place, and time  Psych:  She has a normal mood and affect  Skin - warm and dry, normal color, no suspicious lesions noted  Chest - effort normal  Heart - normal rate and regular rhythm  Abdomen - soft, nontender  Extremities:  No swelling or varicosities noted   TODAY'S NT DC/DA TWINS 13+5 WKS,bilat enlarged ovaries with multiple simple cysts,largest cysts: right ovary (#1) 5 x 4.6 x 3.9 cm,(#2) 3.6 x 2.5 x 3.4 cm(#3) 3.7 x 2.4 x 3.3 cm,left ovarian cysts (#1) 6.6 x 3.3 x 5.7 cm,(#2) 6.5 x 6.6 x 3.1 cm,(#3) 3.7 x 2.4 x 2.8 cm,bilateral ovarian arterial and venous flow visualized  BABY A: cephalic inferior right,posterior placenta,measurements c/w dates,CRL 82.21 mm,NT 1.5 mm,NB present,FHR 140 bpm, BABY B:breech superior left,anterior placenta, measurements c/w dates,CRL 84.65 mm,NT 1.6 mm.NB present, FHT 147 bpm  No results found for this or any previous visit (from the past 24 hour(s)).  Assessment & Plan:  1) High-Risk Pregnancy G3P2002 at [redacted]w[redacted]d with an Estimated Date of Delivery: 07/28/21   2) Initial OB visit  3) DC/DA twins  4) multiple bilateral simple ovarian cysts, keep eye on  w/routine USs  Meds: No orders of the defined types were placed in this encounter.   Initial labs obtained Continue prenatal vitamins Reviewed n/v relief measures and warning s/s to report Reviewed recommended weight gain based on pre-gravid BMI Encouraged well-balanced diet Genetic & carrier screening discussed: requests Panorama, NT/IT, and Horizon , declines AFP Ultrasound discussed; fetal survey: requested CCNC completed> form faxed if has or is planning to apply for medicaid The nature of  - [facility] for [employer] with multiple MDs and other Advanced Practice Providers was  explained to patient; also emphasized that fellows, residents, and students are part of our team. Has home bp cuff.. Check bp weekly, let us know if >140/90.        Tami Velasquez 10:18 AM

## 2021-01-26 LAB — GC/CHLAMYDIA PROBE AMP
Chlamydia trachomatis, NAA: NEGATIVE
Neisseria Gonorrhoeae by PCR: NEGATIVE

## 2021-01-27 LAB — PMP SCREEN PROFILE (10S), URINE
Amphetamine Scrn, Ur: NEGATIVE ng/mL
BARBITURATE SCREEN URINE: NEGATIVE ng/mL
BENZODIAZEPINE SCREEN, URINE: NEGATIVE ng/mL
CANNABINOIDS UR QL SCN: NEGATIVE ng/mL
Cocaine (Metab) Scrn, Ur: NEGATIVE ng/mL
Creatinine(Crt), U: 89.3 mg/dL (ref 20.0–300.0)
Methadone Screen, Urine: NEGATIVE ng/mL
OXYCODONE+OXYMORPHONE UR QL SCN: NEGATIVE ng/mL
Opiate Scrn, Ur: POSITIVE ng/mL — AB
Ph of Urine: 6.5 (ref 4.5–8.9)
Phencyclidine Qn, Ur: NEGATIVE ng/mL
Propoxyphene Scrn, Ur: NEGATIVE ng/mL

## 2021-01-29 LAB — URINE CULTURE: Organism ID, Bacteria: NO GROWTH

## 2021-02-01 LAB — CBC/D/PLT+RPR+RH+ABO+RUBIGG...
Antibody Screen: NEGATIVE
Basophils Absolute: 0 10*3/uL (ref 0.0–0.2)
Basos: 0 %
EOS (ABSOLUTE): 0.1 10*3/uL (ref 0.0–0.4)
Eos: 1 %
HCV Ab: 0.1 s/co ratio (ref 0.0–0.9)
HIV Screen 4th Generation wRfx: NONREACTIVE
Hematocrit: 35.1 % (ref 34.0–46.6)
Hemoglobin: 12 g/dL (ref 11.1–15.9)
Hepatitis B Surface Ag: NEGATIVE
Immature Grans (Abs): 0 10*3/uL (ref 0.0–0.1)
Immature Granulocytes: 0 %
Lymphocytes Absolute: 1.6 10*3/uL (ref 0.7–3.1)
Lymphs: 21 %
MCH: 32.2 pg (ref 26.6–33.0)
MCHC: 34.2 g/dL (ref 31.5–35.7)
MCV: 94 fL (ref 79–97)
Monocytes Absolute: 0.5 10*3/uL (ref 0.1–0.9)
Monocytes: 6 %
Neutrophils Absolute: 5.6 10*3/uL (ref 1.4–7.0)
Neutrophils: 72 %
Platelets: 280 10*3/uL (ref 150–450)
RBC: 3.73 x10E6/uL — ABNORMAL LOW (ref 3.77–5.28)
RDW: 12.6 % (ref 11.7–15.4)
RPR Ser Ql: NONREACTIVE
Rh Factor: NEGATIVE
Rubella Antibodies, IGG: 2.73 index (ref >0.99)
WBC: 7.8 10*3/uL (ref 3.4–10.8)

## 2021-02-01 LAB — INTEGRATED 1
Crown Rump Length Twin B: 84.7 mm
Crown Rump Length: 82.2 mm
Gest. Age on Collection Date: 14 weeks
Maternal Age at EDD: 33.5 yr
NT Twin B: 1.6 mm
Nuchal Translucency (NT): 1.5 mm
Number of Fetuses: 2
PAPP-A Value: 7686.3 ng/mL
Weight: 135 [lb_av]

## 2021-02-01 LAB — HCV INTERPRETATION

## 2021-02-05 ENCOUNTER — Encounter: Payer: Self-pay | Admitting: Advanced Practice Midwife

## 2021-02-22 ENCOUNTER — Other Ambulatory Visit: Payer: Self-pay

## 2021-02-22 ENCOUNTER — Other Ambulatory Visit: Payer: No Typology Code available for payment source

## 2021-02-22 ENCOUNTER — Encounter: Payer: Self-pay | Admitting: Obstetrics & Gynecology

## 2021-02-22 ENCOUNTER — Ambulatory Visit (INDEPENDENT_AMBULATORY_CARE_PROVIDER_SITE_OTHER): Payer: No Typology Code available for payment source | Admitting: Obstetrics & Gynecology

## 2021-02-22 VITALS — BP 108/64 | HR 84 | Wt 140.4 lb

## 2021-02-22 DIAGNOSIS — Z1379 Encounter for other screening for genetic and chromosomal anomalies: Secondary | ICD-10-CM

## 2021-02-22 DIAGNOSIS — Z029 Encounter for administrative examinations, unspecified: Secondary | ICD-10-CM

## 2021-02-22 DIAGNOSIS — O0992 Supervision of high risk pregnancy, unspecified, second trimester: Secondary | ICD-10-CM

## 2021-02-22 DIAGNOSIS — R892 Abnormal level of other drugs, medicaments and biological substances in specimens from other organs, systems and tissues: Secondary | ICD-10-CM

## 2021-02-22 NOTE — Progress Notes (Signed)
HIGH-RISK PREGNANCY VISIT Patient name: Tami Velasquez MRN [id number]  Date of birth: November 27, 1990 Chief Complaint:   High Risk Gestation (2nd IT)  History of Present Illness:   Tami Velasquez is a 31 y.o. G9P2002 female at [redacted]w[redacted]d with an Estimated Date of Delivery: 07/28/21 being seen today for ongoing management of a high-risk pregnancy complicated by DC/DA twins.    Today she reports no complaints.   Contractions: Not present. Vag. Bleeding: None.  Movement: Present. denies leaking of fluid.   Depression screen [facility] 2/9 01/25/2021  Decreased Interest 1  Down, Depressed, Hopeless 0  PHQ - 2 Score 1  Altered sleeping 1  Tired, decreased energy 1  Change in appetite 1  Feeling bad or failure about yourself  0  Trouble concentrating 2  Moving slowly or fidgety/restless 2  Suicidal thoughts 0  PHQ-9 Score 8     Current Outpatient Medications  Medication Instructions   Prenatal Vit-Fe Fumarate-FA (PRENATAL VITAMIN PO) Oral     Review of Systems:   Pertinent items are noted in HPI Denies abnormal vaginal discharge w/ itching/odor/irritation, headaches, visual changes, shortness of breath, chest pain, abdominal pain, severe nausea/vomiting, or problems with urination or bowel movements unless otherwise stated above. Pertinent History Reviewed:  Reviewed past medical,surgical, social, obstetrical and family history.  Reviewed problem list, medications and allergies. Physical Assessment:   Vitals:   02/22/21 1025  BP: 108/64  Pulse: 84  Weight: 140 lb 6.4 oz (63.7 kg)  Body mass index is 21.99 kg/m.           Physical Examination:   General appearance: alert, well appearing, and in no distress  Mental status: alert, oriented to person, place, and time, normal mood, behavior, speech, dress, motor activity, and thought processes  Skin: warm & dry   Extremities: Edema: None    Cardiovascular: normal heart rate noted  Respiratory: normal respiratory effort, no  distress  Abdomen: gravid, soft, non-tender  Pelvic: Cervical exam deferred         Fetal Status:     Movement: Present   Twin A: 140, Twin B: 140 (completed by [place])  Fetal Surveillance Testing today: bedside US for FHT   Chaperone: N/A    No results found for this or any previous visit (from the past 24 hour(s)).   Assessment & Plan:  High-risk pregnancy: G3P2002 at [redacted]w[redacted]d with an Estimated Date of Delivery: 07/28/21   1) DC/DA twins -growth scans scheduled every 4wks  2) +Opiate test -reviewed results- pt denies any opioid use and had no idea why it would be positive []  repeat testing done today  Meds: No orders of the defined types were placed in this encounter.   Labs/procedures today: IT2  Treatment Plan:  as outlined above  Reviewed: Preterm labor symptoms and general obstetric precautions including but not limited to vaginal bleeding, contractions, leaking of fluid and fetal movement were reviewed in detail with the patient.  All questions were answered. Pt has home bp cuff. Check bp weekly, let us know if >140/90.   Follow-up: Return in about 3 weeks (around 03/15/2021) for HROB visit/anatomy scan AND growth every 4 wks.   Future Appointments  Date Time Provider [place]  03/19/2021  3:15 PM [facility] - FTO[facility]YN [place] [facility] None    Orders Placed This Encounter  Procedures   INTEGRATED 2    Janyth Pupa, DO Attending [place], [place] for [employer], [employer]

## 2021-02-24 LAB — INTEGRATED 2
AFP MoM: 3.07
Alpha-Fetoprotein: 137.8 ng/mL
Crown Rump Length Twin B: 84.7 mm
Crown Rump Length: 82.2 mm
DIA MoM: 2.59
DIA Value: 425.8 pg/mL
Estriol, Unconjugated: 2.98 ng/mL
Gest. Age on Collection Date: 14 weeks
Gestational Age: 18 weeks
Maternal Age at EDD: 33.5 yr
NT Twin B: 1.6 mm
Nuchal Translucency (NT): 1.5 mm
Nuchal Translucency MoM: 0.77
Number of Fetuses: 2
PAPP-A Value: 7686.3 ng/mL
Weight: 135 [lb_av]
Weight: 140 [lb_av]
hCG MoM: 4.24
hCG Value: 121.4 IU/mL
uE3 MoM: 2

## 2021-02-24 LAB — PMP SCREEN PROFILE (10S), URINE
Amphetamine Scrn, Ur: NEGATIVE ng/mL
BARBITURATE SCREEN URINE: NEGATIVE ng/mL
BENZODIAZEPINE SCREEN, URINE: NEGATIVE ng/mL
CANNABINOIDS UR QL SCN: NEGATIVE ng/mL
Cocaine (Metab) Scrn, Ur: NEGATIVE ng/mL
Creatinine(Crt), U: 63.8 mg/dL (ref 20.0–300.0)
Methadone Screen, Urine: NEGATIVE ng/mL
OXYCODONE+OXYMORPHONE UR QL SCN: NEGATIVE ng/mL
Opiate Scrn, Ur: NEGATIVE ng/mL
Ph of Urine: 6.6 (ref 4.5–8.9)
Phencyclidine Qn, Ur: NEGATIVE ng/mL
Propoxyphene Scrn, Ur: NEGATIVE ng/mL

## 2021-03-16 ENCOUNTER — Other Ambulatory Visit: Payer: Self-pay | Admitting: Advanced Practice Midwife

## 2021-03-16 DIAGNOSIS — Z363 Encounter for antenatal screening for malformations: Secondary | ICD-10-CM

## 2021-03-16 DIAGNOSIS — O30041 Twin pregnancy, dichorionic/diamniotic, first trimester: Secondary | ICD-10-CM

## 2021-03-19 ENCOUNTER — Ambulatory Visit (INDEPENDENT_AMBULATORY_CARE_PROVIDER_SITE_OTHER): Payer: No Typology Code available for payment source

## 2021-03-19 ENCOUNTER — Ambulatory Visit (INDEPENDENT_AMBULATORY_CARE_PROVIDER_SITE_OTHER): Admitting: Obstetrics & Gynecology

## 2021-03-19 ENCOUNTER — Other Ambulatory Visit: Payer: Self-pay

## 2021-03-19 ENCOUNTER — Encounter: Payer: Self-pay | Admitting: Obstetrics & Gynecology

## 2021-03-19 VITALS — BP 98/60 | HR 63 | Wt 146.0 lb

## 2021-03-19 DIAGNOSIS — O30041 Twin pregnancy, dichorionic/diamniotic, first trimester: Secondary | ICD-10-CM | POA: Diagnosis not present

## 2021-03-19 DIAGNOSIS — O0992 Supervision of high risk pregnancy, unspecified, second trimester: Secondary | ICD-10-CM

## 2021-03-19 DIAGNOSIS — Z363 Encounter for antenatal screening for malformations: Secondary | ICD-10-CM

## 2021-03-19 DIAGNOSIS — O30042 Twin pregnancy, dichorionic/diamniotic, second trimester: Secondary | ICD-10-CM

## 2021-03-19 DIAGNOSIS — Z6791 Unspecified blood type, Rh negative: Secondary | ICD-10-CM

## 2021-03-19 NOTE — Progress Notes (Signed)
[place] 21+2 wks,DC/DA twins,normal right ovary,two simple left ovarian cysts (#1) 5.3 x 5 x 3 cm,(#2) 2 x 1.1 x 1.2 cm,CX 3.8 cm BABY A: female,cephalic right,posterior placenta gr 0,FHR 140 bpm,mild bilateral dilated lateral ventricles,right ventricle 1.2 cm,left ventricle 1.2 cm,absent cavum septum pellucidum,SVP of fluid 5.5 cm,EFW 476 g 83%,anatomy complete BABY B: female,cephalic left,anterior placenta gr 0,fhr 146 bpm,SVP of fluid 6.9 cm,EFW 489 g 89%,anatomy complete,no obvious abnormalities,discordance 2.7%

## 2021-03-19 NOTE — Progress Notes (Signed)
HIGH-RISK PREGNANCY VISIT Patient name: Tami Velasquez MRN [id number]  Date of birth: [birth year] Chief Complaint:   Routine Prenatal Visit  History of Present Illness:   Tami Velasquez is a 31 y.o. G3P2002 female at [redacted]w[redacted]d with an Estimated Date of Delivery: 07/28/21 being seen today for ongoing management of a high-risk pregnancy complicated by: DC/DA twins.    Today she reports no complaints.   Contractions: Not present. Vag. Bleeding: None.  Movement: Present. denies leaking of fluid.   Depression screen [facility] 2/9 01/25/2021  Decreased Interest 1  Down, Depressed, Hopeless 0  PHQ - 2 Score 1  Altered sleeping 1  Tired, decreased energy 1  Change in appetite 1  Feeling bad or failure about yourself  0  Trouble concentrating 2  Moving slowly or fidgety/restless 2  Suicidal thoughts 0  PHQ-9 Score 8     Current Outpatient Medications  Medication Instructions   Prenatal Vit-Fe Fumarate-FA (PRENATAL VITAMIN PO) Oral     Review of Systems:   Pertinent items are noted in HPI Denies abnormal vaginal discharge w/ itching/odor/irritation, headaches, visual changes, shortness of breath, chest pain, abdominal pain, severe nausea/vomiting, or problems with urination or bowel movements unless otherwise stated above. Pertinent History Reviewed:  Reviewed past medical,surgical, social, obstetrical and family history.  Reviewed problem list, medications and allergies. Physical Assessment:   Vitals:   03/19/21 1621  BP: 98/60  Pulse: 63  Weight: 146 lb (66.2 kg)  Body mass index is 22.87 kg/m.           Physical Examination:   General appearance: alert, well appearing, and in no distress  Mental status: normal mood, behavior, speech, dress, motor activity, and thought processes  Skin: warm & dry   Extremities: Edema: None    Cardiovascular: normal heart rate noted  Respiratory: normal respiratory effort, no distress  Abdomen: gravid, soft, non-tender  Pelvic: Cervical exam  deferred         Fetal Status:     Movement: Present   Growth scan today  Fetal Surveillance Testing today: Prelim BABY A: female,cephalic right,posterior placenta gr 0,FHR 140 bpm,mild bilateral dilated lateral ventricles,right ventricle 1.2 cm,left ventricle 1.2 cm,absent cavum septum pellucidum,SVP of fluid 5.5 cm,EFW 476 g 83%,anatomy complete  BABY B: female,cephalic left,anterior placenta gr 0,fhr 146 bpm,SVP of fluid 6.9 cm,EFW 489 g 89%,anatomy complete,no obvious abnormalities,discordance 2.7%  Chaperone: N/A    No results found for this or any previous visit (from the past 24 hour(s)).   Assessment & Plan:  High-risk pregnancy: G3P2002 at [redacted]w[redacted]d with an Estimated Date of Delivery: 07/28/21   1) DC/DA twins -continue growth q 4wks -due to abnormality seen in Twin A- follow growth scan with MFM  2) O neg- []  RhoGAM @ 28wks   Meds: No orders of the defined types were placed in this encounter.   Labs/procedures today: growth scan  Treatment Plan:  continue routine OB care  Reviewed: Preterm labor symptoms and general obstetric precautions including but not limited to vaginal bleeding, contractions, leaking of fluid and fetal movement were reviewed in detail with the patient.  All questions were answered. Pt has home bp cuff. Check bp weekly, let us know if >140/90.   Follow-up: Return in about 4 weeks (around 04/16/2021) for [place] visit.   Future Appointments  Date Time Provider [place]  04/16/2021  9:00 AM [facility] - [facility]YN [place] [facility] None  04/16/2021 10:30 AM Roma Schanz, CNM [facility]TOBGYN  05/14/2021  9:00 AM  [facility] - [facility]TO[facility]YN [place] [facility] None  05/14/2021 10:30 AM Janyth Pupa, DO [facility]TO[facility]YN  06/11/2021  9:00 AM [facility] - [facility]YN [place] [facility] None  06/11/2021 10:50 AM Roma Schanz, CNM [facility] FTO[facility]YN    No orders of the defined types were placed in this encounter.   Janyth Pupa, DO Attending [place], [facility] for  [employer], [place]

## 2021-03-20 ENCOUNTER — Encounter: Payer: Self-pay | Admitting: *Deleted

## 2021-03-22 ENCOUNTER — Other Ambulatory Visit: Payer: Self-pay | Admitting: *Deleted

## 2021-03-22 DIAGNOSIS — O0992 Supervision of high risk pregnancy, unspecified, second trimester: Secondary | ICD-10-CM

## 2021-03-22 DIAGNOSIS — O30042 Twin pregnancy, dichorionic/diamniotic, second trimester: Secondary | ICD-10-CM

## 2021-03-23 ENCOUNTER — Telehealth: Payer: Self-pay

## 2021-03-23 NOTE — Telephone Encounter (Signed)
Spoke with patient - made her aware of the Anatomy ultrasound appointment for twins on Friday 04/13/21 at 12:30pm

## 2021-04-13 ENCOUNTER — Ambulatory Visit: Payer: No Typology Code available for payment source | Admitting: *Deleted

## 2021-04-13 ENCOUNTER — Ambulatory Visit: Payer: No Typology Code available for payment source | Attending: Obstetrics & Gynecology

## 2021-04-13 ENCOUNTER — Other Ambulatory Visit: Payer: Self-pay | Admitting: *Deleted

## 2021-04-13 ENCOUNTER — Ambulatory Visit: Attending: Obstetrics and Gynecology

## 2021-04-13 ENCOUNTER — Encounter: Payer: Self-pay | Admitting: *Deleted

## 2021-04-13 ENCOUNTER — Other Ambulatory Visit: Payer: Self-pay

## 2021-04-13 ENCOUNTER — Ambulatory Visit: Attending: Obstetrics and Gynecology | Admitting: Obstetrics and Gynecology

## 2021-04-13 VITALS — BP 106/67 | HR 82

## 2021-04-13 DIAGNOSIS — O3509X1 Maternal care for (suspected) other central nervous system malformation or damage in fetus, fetus 1: Secondary | ICD-10-CM

## 2021-04-13 DIAGNOSIS — Z3A24 24 weeks gestation of pregnancy: Secondary | ICD-10-CM | POA: Diagnosis not present

## 2021-04-13 DIAGNOSIS — Z6791 Unspecified blood type, Rh negative: Secondary | ICD-10-CM | POA: Diagnosis present

## 2021-04-13 DIAGNOSIS — O30042 Twin pregnancy, dichorionic/diamniotic, second trimester: Secondary | ICD-10-CM | POA: Insufficient documentation

## 2021-04-13 DIAGNOSIS — O26899 Other specified pregnancy related conditions, unspecified trimester: Secondary | ICD-10-CM

## 2021-04-13 DIAGNOSIS — O30002 Twin pregnancy, unspecified number of placenta and unspecified number of amniotic sacs, second trimester: Secondary | ICD-10-CM

## 2021-04-13 DIAGNOSIS — O0992 Supervision of high risk pregnancy, unspecified, second trimester: Secondary | ICD-10-CM | POA: Insufficient documentation

## 2021-04-13 NOTE — Progress Notes (Signed)
Maternal-Fetal Medicine  ? ?Name: Linnet Bottari ?DOB: August 21, 1990 ?MRN: [id number] ?Referring Provider: Myna Hidalgo, DO ? ?I had the pleasure of seeing Ms. Tami Velasquez [facility] at [facility]. She is G3 P2002 at 24w 6d gestation with twin pregnancy with suspected ventriculomegaly in twin A. ?On cell-free fetal DNA screening, the risks of fetal aneuploidies are not increased.  Dizygotic twins with unlike 6 prior was confirmed on cell-free fetal DNA screening. ?This is a natural conception. ? ?Past medical history: No history of hypertension or diabetes or any chronic medical conditions. ?Past surgical history: Wisdom tooth surgery. ?Medications: Prenatal vitamins. ?Allergies: Zithromax (hives and shortness of breath). ?Social history: Denies tobacco or drug or alcohol use. ?Family history: No history of venous thromboembolism in the family. ?GYN history: History of abnormal Pap smears but no cervical surgeries.  No history of breast disease. ?Obstetric history ?07/2015: Term vaginal delivery of a female infant weighing 7 pounds and 2 ounces at birth.  Her daughter had gastroschisis that was repaired.  She is in good health. ?11/2018: Term vaginal delivery of a female infant weighing 8 pounds at birth.  Her son is in good health. ? ?Ultrasound ?We confirmed with dichorionic-diamniotic twin pregnancy. ? ?Twin A: Maternal right, cephalic presentation, posterior placenta, female fetus.  Fetal biometry is consistent with the previously established dates.  Amniotic fluid is normal and good fetal activity seen. ?-Severe bilateral ventriculomegaly are seen and the lateral ventricles measure between 1.7 to 2 centimeters. Dangling choroid are seen.  Frontal horn dilations are seen.  No dilation of third ventricle is seen. ?-The CSP is not seen. ?-Posterior fossa appears normal with intact vermis. ?-Midline including thalamus appears normal. ?-On sagittal view, the proximal part of corpus callosum (genu) appears to  be intact. ?-Cardiac anatomy appears normal. ?Rest of the fetal anatomical survey appears normal. ? ?Twin B: Maternal left, anterior placenta, transverse lie and head to maternal left, female fetus.  Amniotic fluid is normal and good fetal activity seen.  Fetal biometry is consistent with the previously established dates.  No markers of aneuploidies or fetal structural defects are seen. ?Growth discordancy: 3% (normal). ? ?Our concerns include: ? ?Severe ventriculomegaly ?I explained the findings with help of images and compared with normal ventricular measurements in twin B. ? ?Discussed the possible causes including fetal chromosomal anomalies or genetic syndromes, fetal infections (CMV/toxoplasmosis), structural anomalies including agenesis or partial agenesis of corpus callosum. Other rare causes include vascular accidents. ? ?Differential diagnosis includes agenesis or partial agenesis of corpus callosum. ? ?I counseled the couple that prognosis depends on the diagnosis of the condition leading to ventriculomegaly. In isolated ACC, the prognosis is good and normal neurodevelopmental outcome is expected in 70% of cases. ? ?X-linked aqueductal stenosis is a rare condition and is seen in female fetus. Her son is not affected. We also did not see third ventricle dilation on today's ultrasound. ? ?Severe ventriculomegaly will require intervention after birth including placement of shunt. ?I discussed and recommended amniocentesis because of the association of abnormal chromosomes and genetic conditions. Microarray on amniocentesis will diagnose some but not all genetic conditions. I explained the procedure and possible complication of preterm delivery or rupture of membranes in 1 in 500 procedures. ? ?The couple will discuss over the weekend and communicate their decision. ? ?I recommended fetal brain MRI that can detect additional findings, which will be helpful in counseling. We will set up consultation with  neurology/neurosurgery. ? ?I discussed and recommended fetal echocardiography. ? ?  Patient was sent over today to the lab to have her blood drawn for CMV and toxoplasmosis serology. ? ?Twin Pregnancy ?I explained the significance of chorionicity and its implications. Possible complications associated with twin pregnancy are preterm labor/delivery (most common), fetal growth restriction of one or both twins, miscarriage, malpresentations and increased cesarean delivery rate, postpartum hemorrhage. I also informed her that maternal complications including gestational diabetes and gestational hypertension/preeclampsia are more common. ?I discussed the mode of delivery that is based on the presentations.  If both have Vx/Vx or Vx/non-vertex presentations, vaginal delivery may be considered. In Vx/non-vx, vaginal delivery followed by internal podalic version of second twin will achieve vaginal delivery. In non-vx first twin presentation, cesarean delivery will be performed. ? ?Low-dose aspirin is beneficial in preventing preeclampsia. Twin pregnancy is at high risk for preeclampsia. I recommended aspirin (81 mg daily) to be started from now until delivery. She does not have contraindications to aspirin. ? ?Recommendations ?-We have requested fetal echocardiography [facility] [place]). ?-We will set up an appointment for fetal MRI in 2 to 3 weeks. ?-An appointment was made for her to return in 4 weeks for follow-up ultrasound and genetic counseling. ?-Weekly BPP from [redacted] weeks gestation till delivery. ?-We will make referrals to neurosurgery/neurology after MRI results in the third trimester. ?-We will communicate CMV and toxoplasmosis results to the patient. ? ? ?Thank you for consultation.  If you have any questions or concerns, please contact me the [facility].  Consultation including face-to-face (more than 50%) counseling 45 minutes. ? ?

## 2021-04-14 ENCOUNTER — Encounter: Payer: Self-pay | Admitting: Obstetrics and Gynecology

## 2021-04-14 LAB — CMV IGM: CMV IgM Ser EIA-aCnc: <30 AU/mL (ref 0.0–29.9)

## 2021-04-14 LAB — TOXOPLASMA GONDII ANTIBODY, IGG: Toxoplasma IgG Ratio: <3 IU/mL (ref 0.0–7.1)

## 2021-04-14 LAB — INFECT DISEASE AB IGM REFLEX 1

## 2021-04-14 LAB — CMV ANTIBODY, IGG (EIA): CMV Ab - IgG: 7 U/mL — ABNORMAL HIGH (ref 0.00–0.59)

## 2021-04-14 LAB — TOXOPLASMA GONDII ANTIBODY, IGM: Toxoplasma Antibody- IgM: <3 AU/mL (ref 0.0–7.9)

## 2021-04-16 ENCOUNTER — Other Ambulatory Visit: Payer: Self-pay | Admitting: Obstetrics & Gynecology

## 2021-04-16 ENCOUNTER — Encounter: Payer: Self-pay | Admitting: Women's Health

## 2021-04-16 ENCOUNTER — Ambulatory Visit (INDEPENDENT_AMBULATORY_CARE_PROVIDER_SITE_OTHER): Admitting: Women's Health

## 2021-04-16 ENCOUNTER — Other Ambulatory Visit: Payer: Self-pay

## 2021-04-16 ENCOUNTER — Other Ambulatory Visit

## 2021-04-16 VITALS — BP 100/64 | HR 81 | Wt 157.0 lb

## 2021-04-16 DIAGNOSIS — O0992 Supervision of high risk pregnancy, unspecified, second trimester: Secondary | ICD-10-CM

## 2021-04-16 DIAGNOSIS — Z3A25 25 weeks gestation of pregnancy: Secondary | ICD-10-CM

## 2021-04-16 DIAGNOSIS — O30042 Twin pregnancy, dichorionic/diamniotic, second trimester: Secondary | ICD-10-CM

## 2021-04-16 DIAGNOSIS — O359XX1 Maternal care for (suspected) fetal abnormality and damage, unspecified, fetus 1: Secondary | ICD-10-CM

## 2021-04-16 LAB — POCT URINALYSIS DIPSTICK OB
Blood, UA: NEGATIVE
Glucose, UA: NEGATIVE
Ketones, UA: NEGATIVE
Leukocytes, UA: NEGATIVE
Nitrite, UA: NEGATIVE
POC,PROTEIN,UA: NEGATIVE

## 2021-04-16 NOTE — Progress Notes (Signed)
? ?HIGH-RISK PREGNANCY VISIT ?Patient name: Tami Velasquez MRN [id number]  Date of birth: Jan 05, 1991 ?Chief Complaint:   ?High Risk Gestation ? ?History of Present Illness:   ?Tami Velasquez is a 31 y.o. G11P2002 female at [redacted]w[redacted]d with an Estimated Date of Delivery: 07/28/21 being seen today for ongoing management of a high-risk pregnancy complicated by multiple gestation DCDA twins, twin A w/ absent CSP, dangling choroid and severe bilateral ventriculomegaly.   ? ?Today she reports  some pressure . Contractions: Not present. Vag. Bleeding: None.  Movement: Present. denies leaking of fluid.  ? ?Depression screen [facility] 2/9 01/25/2021  ?Decreased Interest 1  ?Down, Depressed, Hopeless 0  ?PHQ - 2 Score 1  ?Altered sleeping 1  ?Tired, decreased energy 1  ?Change in appetite 1  ?Feeling bad or failure about yourself  0  ?Trouble concentrating 2  ?Moving slowly or fidgety/restless 2  ?Suicidal thoughts 0  ?PHQ-9 Score 8  ? ?  ?GAD 7 : Generalized Anxiety Score 01/25/2021  ?Nervous, Anxious, on Edge 2  ?Control/stop worrying 0  ?Worry too much - different things 1  ?Trouble relaxing 0  ?Restless 0  ?Easily annoyed or irritable 2  ?Afraid - awful might happen 0  ?Total GAD 7 Score 5  ? ? ? ?Review of Systems:   ?Pertinent items are noted in HPI ?Denies abnormal vaginal discharge w/ itching/odor/irritation, headaches, visual changes, shortness of breath, chest pain, abdominal pain, severe nausea/vomiting, or problems with urination or bowel movements unless otherwise stated above. ?Pertinent History Reviewed:  ?Reviewed past medical,surgical, social, obstetrical and family history.  ?Reviewed problem list, medications and allergies. ?Physical Assessment:  ? ?Vitals:  ? 04/16/21 0910  ?BP: 100/64  ?Pulse: 81  ?Weight: 157 lb (71.2 kg)  ?Body mass index is 24.59 kg/m?. ?     ?     Physical Examination:  ? General appearance: alert, well appearing, and in no distress ? Mental status: alert, oriented to person, place, and  time ? Skin: warm & dry  ? Extremities: Edema: Trace  ?  Cardiovascular: normal heart rate noted ? Respiratory: normal respiratory effort, no distress ? Abdomen: gravid, soft, non-tender ? Pelvic: Cervical exam deferred        ? ?Fetal Status: Fetal Heart Rate (bpm): 128/139   Movement: Present   ? ?Fetal Surveillance Testing today: dopplers, had u/s w/ MFM 3/17  ? ?Chaperone: N/A   ? ?Results for orders placed or performed in visit on 04/16/21 (from the past 24 hour(s))  ?POC Urinalysis Dipstick OB  ? Collection Time: 04/16/21  9:11 AM  ?Result Value Ref Range  ? Color, UA    ? Clarity, UA    ? Glucose, UA Negative Negative  ? Bilirubin, UA    ? Ketones, UA neg   ? Spec Grav, UA    ? Blood, UA neg   ? pH, UA    ? POC,PROTEIN,UA Negative Negative, Trace, Small (1+), Moderate (2+), Large (3+), 4+  ? Urobilinogen, UA    ? Nitrite, UA neg   ? Leukocytes, UA Negative Negative  ? Appearance    ? Odor    ?  ?Assessment & Plan:  ?High-risk pregnancy: G3P2002 at [redacted]w[redacted]d with an Estimated Date of Delivery: 07/28/21  ? ?1) DCDA twins ? ?2) Twin A w/ absent CSP, dangling choroid, severe bilateral ventriculomegaly, per MFM note 3/17- scheduling fetal echo, fetal MRI, f/u u/s w/ MFM 4/19 as scheduled, weekly BPP @ 32wks, pt was told will likely need  to deliver @ [facility] ? 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 ?4) Pressure> discussed belts, cradles, k-tape ? ?Meds: No orders of the defined types were placed in this encounter. ? ? ?Labs/procedures today: none ? ?Treatment Plan:  per MFM ? ?Reviewed: Preterm labor symptoms and general obstetric precautions including but not limited to vaginal bleeding, contractions, leaking of fluid and fetal movement were reviewed in detail with the patient.  All questions were answered. Does have home bp cuff. Office bp cuff given: not applicable. Check bp weekly, let us know if consistently >140 and/or >90. ? ?Follow-up: Return in about 3 weeks (around 05/07/2021) for [place], MD only, PN2 (cancel 4/17 appt and  u/s). ? ? ?Future Appointments  ?Date Time Provider [place]  ?04/16/2021 10:30 AM Roma Schanz, CNM [facility]  ?05/07/2021  8:30 AM [facility]  ?05/07/2021  9:10 AM Elonda Husky Mertie Clause, MD [facility]  ?05/16/2021 12:45 PM [facility] NURSE [facility]  ?05/16/2021  1:00 PM [facility] GENETIC COUNSELING RM [facility] WMC  ?05/16/2021  2:45 PM [facility] US5 [facility] WMC  ?06/11/2021  9:00 AM [facility] [place] [facility] None  ?06/11/2021 10:50 AM Roma Schanz, CNM [facility]  ? ? ?Orders Placed This Encounter  ?Procedures  ? POC Urinalysis Dipstick OB  ? ?[place], [place] ?04/16/2021 ?9:49 AM  ?

## 2021-04-17 ENCOUNTER — Telehealth: Payer: Self-pay | Admitting: Obstetrics and Gynecology

## 2021-04-17 NOTE — Telephone Encounter (Signed)
Informed infectious work-up results.  ?CMV: NO evidence of acute infection (CMV IgM negative) and increased IgG levels are consistent with immunity. ?Toxoplasmosis: Nonimmune. Explained precautions that is likely to prevent toxoplasmosis in pregnancy. ? ?

## 2021-05-03 ENCOUNTER — Encounter: Payer: Self-pay | Admitting: Obstetrics and Gynecology

## 2021-05-07 ENCOUNTER — Ambulatory Visit: Admitting: Obstetrics & Gynecology

## 2021-05-07 ENCOUNTER — Other Ambulatory Visit

## 2021-05-07 ENCOUNTER — Encounter: Payer: Self-pay | Admitting: Obstetrics & Gynecology

## 2021-05-07 VITALS — BP 104/65 | HR 92 | Wt 159.0 lb

## 2021-05-07 DIAGNOSIS — O359XX1 Maternal care for (suspected) fetal abnormality and damage, unspecified, fetus 1: Secondary | ICD-10-CM

## 2021-05-07 DIAGNOSIS — O0992 Supervision of high risk pregnancy, unspecified, second trimester: Secondary | ICD-10-CM

## 2021-05-07 DIAGNOSIS — Z131 Encounter for screening for diabetes mellitus: Secondary | ICD-10-CM

## 2021-05-07 DIAGNOSIS — O0993 Supervision of high risk pregnancy, unspecified, third trimester: Secondary | ICD-10-CM

## 2021-05-07 DIAGNOSIS — O30042 Twin pregnancy, dichorionic/diamniotic, second trimester: Secondary | ICD-10-CM

## 2021-05-07 DIAGNOSIS — Z3A28 28 weeks gestation of pregnancy: Secondary | ICD-10-CM

## 2021-05-07 NOTE — Progress Notes (Incomplete)
? ? ?HIGH-RISK PREGNANCY VISIT ?Patient name: Tami Velasquez MRN [id number]  Date of birth: [birth year] ?Chief Complaint:   ?Routine Prenatal Visit and High Risk Gestation ? ?History of Present Illness:   ?Tami Velasquez is a 31 y.o. G72P2002 female at [redacted]w[redacted]d with an Estimated Date of Delivery: 07/28/21 being seen today for ongoing management of a high-risk pregnancy complicated by {High Risk OB:23190}.   ? ?Today she reports {pregnancy symptoms:25616::"no complaints"}. Contractions: Irritability. Vag. Bleeding: None.  Movement: Present. {Actions; denies-reports:120008} leaking of fluid.  ? ? ?  05/07/2021  ?  8:57 AM 01/25/2021  ?  8:43 AM  ?Depression screen PHQ 2/9  ?Decreased Interest 0 1  ?Down, Depressed, Hopeless 0 0  ?PHQ - 2 Score 0 1  ?Altered sleeping 1 1  ?Tired, decreased energy 1 1  ?Change in appetite 0 1  ?Feeling bad or failure about yourself  0 0  ?Trouble concentrating 1 2  ?Moving slowly or fidgety/restless 0 2  ?Suicidal thoughts 0 0  ?PHQ-9 Score 3 8  ? ?  ? ?  05/07/2021  ?  8:57 AM 01/25/2021  ?  8:44 AM  ?GAD 7 : Generalized Anxiety Score  ?Nervous, Anxious, on Edge 1 2  ?Control/stop worrying 0 0  ?Worry too much - different things 1 1  ?Trouble relaxing 1 0  ?Restless 0 0  ?Easily annoyed or irritable 1 2  ?Afraid - awful might happen 0 0  ?Total GAD 7 Score 4 5  ? ? ? ?Review of Systems:   ?Pertinent items are noted in HPI ?Denies abnormal vaginal discharge w/ itching/odor/irritation, headaches, visual changes, shortness of breath, chest pain, abdominal pain, severe nausea/vomiting, or problems with urination or bowel movements unless otherwise stated above. ?Pertinent History Reviewed:  ?Reviewed past medical,surgical, social, obstetrical and family history.  ?Reviewed problem list, medications and allergies. ?Physical Assessment:  ? ?Vitals:  ? 05/07/21 0859  ?BP: 104/65  ?Pulse: 92  ?Weight: 159 lb (72.1 kg)  ?Body mass index is 24.9 kg/m?. ?     ?     Physical Examination:  ? General  appearance: {:315021} ? Mental status: {:313008} ? Skin: warm & dry  ? Extremities: Edema: Trace  ?  Cardiovascular: normal heart rate noted ? Respiratory: normal respiratory effort, no distress ? Abdomen: gravid, soft, non-tender ? Pelvic: {Blank single:19197::"Cervical exam performed","Cervical exam deferred"}        ? ?Fetal Status:     Movement: Present   ? ?Fetal Surveillance Testing today: ***  ? ?Chaperone: {Chaperone:19197::"N/A","pt declined","Latisha Cresenzo","Janet Young","Amanda Andrews","Peggy Dones","Angel Neas"}   ? ?No results found for this or any previous visit (from the past 24 hour(s)).  ?Assessment & Plan:  ?High-risk pregnancy: G3P2002 at [redacted]w[redacted]d with an Estimated Date of Delivery: 07/28/21  ? ? ?  ICD-10-CM   ?1. Supervision of high risk pregnancy in second trimester  O09.92   ?  ?2. High-risk pregnancy in third trimester  O09.93 POC Urinalysis Dipstick OB  ?  ?3. Dichorionic diamniotic twin pregnancy in second trimester  O30.042   ?  ?4. Known fetal anomaly, antepartum, fetus 1 of multiple gestation  O76.9XX1   ?  ?  ? ?1) ***, {stable/unstable:60080} ? ?2) ***, {stable/unstable:60080} ? ?Meds: No orders of the defined types were placed in this encounter. ? ? ?Orders:  ?Orders Placed This Encounter  ?Procedures  ? POC Urinalysis Dipstick OB  ?  ? ?Labs/procedures today: {ob lab/procedures:25214} ? ?Treatment Plan:  *** ? ?Reviewed: {Blank single:19197::"Term","Preterm"}  labor symptoms and general obstetric precautions including but not limited to vaginal bleeding, contractions, leaking of fluid and fetal movement were reviewed in detail with the patient.  All questions were answered. {does does not:25387::"Does"} have home bp cuff. Office bp cuff given: {yes/no/default [id number] applicable"}. Check bp {weekly daily:25388::"weekly"}, let us know if consistently {pregnant bp:25389::">140 and/or >90"}. ? ?Follow-up: Return in about 18 days (around 05/25/2021) for [place], with Dr  Elonda Husky. ? ? ?Future Appointments  ?Date Time Provider [place]  ?05/16/2021 12:45 PM [facility] NURSE [facility]  ?05/16/2021  1:00 PM [facility] GENETIC COUNSELING RM [facility] WMC  ?05/16/2021  2:45 PM [facility] US5 [facility]-M[facility]US WMC  ?06/11/2021  9:00 AM [facility] [place] [facility] None  ?06/11/2021 10:50 AM Roma Schanz, CNM [facility]  ? ? ?Orders Placed This Encounter  ?Procedures  ? POC Urinalysis Dipstick OB  ? ?[place]  ?Attending Physician for the [facility] ?05/07/2021 ?10:26 AM ? ?

## 2021-05-08 ENCOUNTER — Other Ambulatory Visit: Payer: Self-pay | Admitting: Women's Health

## 2021-05-08 LAB — CBC
Hematocrit: 30.6 % — ABNORMAL LOW (ref 34.0–46.6)
Hemoglobin: 10.2 g/dL — ABNORMAL LOW (ref 11.1–15.9)
MCH: 32.4 pg (ref 26.6–33.0)
MCHC: 33.3 g/dL (ref 31.5–35.7)
MCV: 97 fL (ref 79–97)
Platelets: 304 10*3/uL (ref 150–450)
RBC: 3.15 x10E6/uL — ABNORMAL LOW (ref 3.77–5.28)
RDW: 12.4 % (ref 11.7–15.4)
WBC: 8.6 10*3/uL (ref 3.4–10.8)

## 2021-05-08 LAB — RPR: RPR Ser Ql: NONREACTIVE

## 2021-05-08 LAB — ANTIBODY SCREEN: Antibody Screen: NEGATIVE

## 2021-05-08 LAB — GLUCOSE TOLERANCE, 2 HOURS W/ 1HR
Glucose, 1 hour: 113 mg/dL (ref 70–179)
Glucose, 2 hour: 75 mg/dL (ref 70–152)
Glucose, Fasting: 73 mg/dL (ref 70–91)

## 2021-05-08 LAB — HIV ANTIBODY (ROUTINE TESTING W REFLEX): HIV Screen 4th Generation wRfx: NONREACTIVE

## 2021-05-08 MED ORDER — FERROUS SULFATE 325 (65 FE) MG PO TABS: 325.0000 mg | ORAL_TABLET | ORAL | 2 refills | Status: AC

## 2021-05-14 ENCOUNTER — Encounter: Admitting: Obstetrics & Gynecology

## 2021-05-14 ENCOUNTER — Other Ambulatory Visit

## 2021-05-16 ENCOUNTER — Ambulatory Visit: Payer: Self-pay | Admitting: Genetics

## 2021-05-16 ENCOUNTER — Ambulatory Visit: Payer: No Typology Code available for payment source | Attending: Obstetrics and Gynecology

## 2021-05-16 ENCOUNTER — Ambulatory Visit (HOSPITAL_BASED_OUTPATIENT_CLINIC_OR_DEPARTMENT_OTHER): Payer: No Typology Code available for payment source | Admitting: Obstetrics

## 2021-05-16 ENCOUNTER — Ambulatory Visit (HOSPITAL_BASED_OUTPATIENT_CLINIC_OR_DEPARTMENT_OTHER): Payer: No Typology Code available for payment source

## 2021-05-16 ENCOUNTER — Ambulatory Visit: Payer: No Typology Code available for payment source | Admitting: *Deleted

## 2021-05-16 VITALS — BP 109/68 | HR 91

## 2021-05-16 DIAGNOSIS — Z6791 Unspecified blood type, Rh negative: Secondary | ICD-10-CM | POA: Insufficient documentation

## 2021-05-16 DIAGNOSIS — O09293 Supervision of pregnancy with other poor reproductive or obstetric history, third trimester: Secondary | ICD-10-CM

## 2021-05-16 DIAGNOSIS — O30043 Twin pregnancy, dichorionic/diamniotic, third trimester: Secondary | ICD-10-CM | POA: Diagnosis present

## 2021-05-16 DIAGNOSIS — O3509X1 Maternal care for (suspected) other central nervous system malformation or damage in fetus, fetus 1: Secondary | ICD-10-CM | POA: Diagnosis present

## 2021-05-16 DIAGNOSIS — O3509X0 Maternal care for (suspected) other central nervous system malformation or damage in fetus, not applicable or unspecified: Secondary | ICD-10-CM

## 2021-05-16 DIAGNOSIS — O26899 Other specified pregnancy related conditions, unspecified trimester: Secondary | ICD-10-CM | POA: Insufficient documentation

## 2021-05-16 DIAGNOSIS — O283 Abnormal ultrasonic finding on antenatal screening of mother: Secondary | ICD-10-CM | POA: Insufficient documentation

## 2021-05-16 DIAGNOSIS — Z3A29 29 weeks gestation of pregnancy: Secondary | ICD-10-CM

## 2021-05-16 DIAGNOSIS — O0993 Supervision of high risk pregnancy, unspecified, third trimester: Secondary | ICD-10-CM

## 2021-05-16 DIAGNOSIS — O30002 Twin pregnancy, unspecified number of placenta and unspecified number of amniotic sacs, second trimester: Secondary | ICD-10-CM | POA: Diagnosis present

## 2021-05-16 NOTE — Progress Notes (Signed)
MFM Note ? ?Tami Velasquez was seen for a follow up exam due to a dichorionic, diamniotic twin gestation where severe bilateral ventriculomegaly was noted in twin A's brain.  She had a fetal MRI performed at [facility] that showed moderate supratentorial ventriculomegaly in twin A.  The corpus callosum is present.  There is normal cerebral morphology and pattern of sulcation.  The cerebellum and brainstem also appear normal in twin A.  Her CMV and toxoplasmosis testing did not indicate an acute infection. ? ?Her cell free DNA test indicated a low risk for trisomy 21, 18, and 13.  A female and female fetus are predicted.  These are dizygotic twins. ? ?She was informed that the fetal growth and amniotic fluid level appears appropriate for her gestational age for both twin A and twin B. ? ?Severe bilateral ventriculomegaly continues to be noted in twin A's brain.  The patient was advised that the cause of the ventriculomegaly in twin A's brain remains undetermined as her MRI showed normal cerebral morphology, normal cerebellum and brainstem, and the corpus callosum is present.  ? ?She was advised that twin A will most likely require shunt placement after birth to treat the severe ventriculomegaly.  I will make a referral for a prenatal consultation to pediatric neurosurgery at [facility] to discuss the management and the most optimal institution for delivery.   Hopefully, her baby will have a normal neurologic outcome after a shunt is placed after birth. ? ?I will also present her case at a future [facility] meeting.  ? ?Should the pediatric neurosurgeons or the NICU recommend delivery at [facility], we will help her make arrangements to transfer her care later in pregnancy. ? ?The patient has declined an amniocentesis for definitive diagnosis of fetal aneuploidy.   ? ?She has a fetal echocardiogram already scheduled with [facility] pediatric cardiology on May 24, 2021. ? ?Due to severe ventriculomegaly in twin A, we will start  weekly BPP's at 32 weeks.   ? ?She will return in 3 weeks for a BPP. ? ?The patient stated that all of her questions had been answered today.   ? ?A total of 20 minutes was spent counseling and coordinating the care for this patient.  Greater than 50% of the time was spent in direct face-to-face contact. ?

## 2021-05-16 NOTE — Progress Notes (Signed)
?Name: Tami Velasquez Indication: ?Ventriculomegaly visualized for Twin A ?Previous child with gastroschisis  ?DOB: Aug 08, 1990 Age: 31 y.o.   ?EDC: 07/28/2021 LMP: 10/21/2020 Referring Provider:  ?Joanette Gula*  ?EGA: [redacted]w[redacted]d Genetic Counselor: ?Staci Righter, MS, CGC  ?OB Hx: [id number] Date of Appointment: 05/16/2021  ?Accompanied by: Arlester Marker Counseling Student Observed Face to Face Time: 30 Minutes  ? ?Medical History:  ?This dichorionic diamniotic pregnancy is Tami Velasquez's 3rd pregnancy. She has two living children. ?Reports she takes prenatal vitamins and iron supplements. ?Denies personal history of diabetes, high blood pressure, thyroid conditions, and seizures. ?Denies bleeding, infections, and fevers in this pregnancy. ?Denies using tobacco, alcohol, or street drugs in this pregnancy.  ? ?Family History: A pedigree was created and scanned into Epic under the Media tab. ?Tami Velasquez reports her 70 year old daughter was born with gastroschisis which was repaired after birth. Her daughter is otherwise healthy. ?Tami Velasquez reports her sister has cerebral palsy.  ?Maternal ethnicity reported as Caucasian and paternal ethnicity reported as Financial controller. ?Denies Ashkenazi Jewish ancestry. ?Family history not remarkable for consanguinity, intellectual disability, autism spectrum disorder, multiple spontaneous abortions, still births, or unexplained neonatal death.   ?   ?Genetic Counseling:  ? ?Ventriculomegaly visualized for Twin A. [place] [facility] in which Twin A was found to have severe bilateral ventriculomegaly. Ventriculomegaly is a condition in which the ventricles in the brain appear larger than normal. The increase in ventricular size can be due to obstruction, loss of cerebral tissue, or underdevelopment of the cerebral hemispheres. Therefore, the etiology is heterogeneous and includes chromosomal abnormalities,  genetic syndromes, hemorrhage, and infections. Tami Velasquez previously completed a maternal serum infection workup which was negative (results available in the lab tab of Epic). Tami Velasquez also previously completed NIPS in this pregnancy (available in the media tab of Epic) and the result is low risk which significantly reduces the risk that the current pregnancy has Down syndrome, Trisomy 67, and Trisomy 67. Genetic counseling reviewed with Tami Velasquez that while the NIPS is low risk, the risk is not zero given the limitations of NIPS. Additionally, ventriculomegaly may be associated with other genetic and chromosomal abnormalities not detected by NIPS. Given this information we discussed the option of amniocentesis for fetal karyotyping, microarray, and possibly whole exome testing. After hearing the above information, Tami Velasquez declined amniocentesis for prenatal diagnosis. She shared with genetic counseling that she would like for all genetic testing to be completed after birth if recommended by the pediatric genetics providers.  ? ?Previous child with gastroschisis. Tami Velasquez reports her 5 year old daughter was born with gastroschisis which was repaired after birth. Her daughter is otherwise healthy. Gastroschisis is an abdominal wall defect characterized by the evisceration of the abdominal organs through a small opening. Gastroschisis always includes the small intestine, but may also involve the stomach, colon, and gonad. The exposed organs found outside the body cavity lack a protective membranous covering. The etiology of gastroschisis is generally thought to be related more to environmental factors than genetic factors. Isolated gastroschisis is most commonly a sporadic event and is not associated with an increased risk for aneuploidy or genetic syndromes. Most cases of gastroschisis are considered isolated events within a family with a very low risk for recurrence for the affected individual's first-degree  relatives. ? ?Birth Defects. All babies have approximately a 3-5% risk for a birth defect and a majority of these defects cannot be detected through the screening or diagnostic testing  listed below. Ultrasound may detect some birth defects, but it may not detect all birth defects. About half of pregnancies with Down syndrome do not show any soft markers on ultrasound. A normal ultrasound does not guarantee a healthy pregnancy. ?  ? ?Testing/Screening Options:  ? ?Amniocentesis. This procedure is available for prenatal diagnosis. Possible procedural difficulties and complications that can arise include maternal infection, cramping, bleeding, fluid leakage, and/or pregnancy loss. The risk for pregnancy loss with an amniocentesis is 1/500. Per the [employer] (ACOG) Practice Bulletin 162, all pregnant women should be offered prenatal assessment for aneuploidy by diagnostic testing regardless of maternal age or other risk factors. If indicated, genetic testing that could be ordered on an amniocentesis sample includes a fetal karyotype, fetal microarray, and testing for specific syndromes. ?  ?  ?Patient Plan: ? ?Proceed with: Routine prenatal care ?Informed consent was obtained. All questions were answered. ? ?Declined: Amniocentesis  ? ?Thank you for sharing in the care of [place] with Tami Velasquez.  ?Please do not hesitate to contact Tami Velasquez if you have any questions. ? ?Staci Righter, MS, CGC ?Certified Genetic Counselor ?

## 2021-05-17 ENCOUNTER — Encounter: Payer: Self-pay | Admitting: Advanced Practice Midwife

## 2021-05-17 ENCOUNTER — Other Ambulatory Visit: Payer: Self-pay | Admitting: *Deleted

## 2021-05-17 DIAGNOSIS — O30023 Conjoined twin pregnancy, third trimester: Secondary | ICD-10-CM

## 2021-05-17 DIAGNOSIS — O283 Abnormal ultrasonic finding on antenatal screening of mother: Secondary | ICD-10-CM

## 2021-05-17 DIAGNOSIS — O3509X1 Maternal care for (suspected) other central nervous system malformation or damage in fetus, fetus 1: Secondary | ICD-10-CM | POA: Insufficient documentation

## 2021-05-17 DIAGNOSIS — G9389 Other specified disorders of brain: Secondary | ICD-10-CM

## 2021-05-25 ENCOUNTER — Encounter: Admitting: Obstetrics & Gynecology

## 2021-06-06 ENCOUNTER — Ambulatory Visit (HOSPITAL_BASED_OUTPATIENT_CLINIC_OR_DEPARTMENT_OTHER): Payer: No Typology Code available for payment source

## 2021-06-06 ENCOUNTER — Ambulatory Visit: Payer: No Typology Code available for payment source | Attending: Obstetrics | Admitting: *Deleted

## 2021-06-06 VITALS — BP 107/72 | HR 90

## 2021-06-06 DIAGNOSIS — O3509X1 Maternal care for (suspected) other central nervous system malformation or damage in fetus, fetus 1: Secondary | ICD-10-CM | POA: Insufficient documentation

## 2021-06-06 DIAGNOSIS — O359XX0 Maternal care for (suspected) fetal abnormality and damage, unspecified, not applicable or unspecified: Secondary | ICD-10-CM | POA: Diagnosis not present

## 2021-06-06 DIAGNOSIS — O09293 Supervision of pregnancy with other poor reproductive or obstetric history, third trimester: Secondary | ICD-10-CM | POA: Insufficient documentation

## 2021-06-06 DIAGNOSIS — O283 Abnormal ultrasonic finding on antenatal screening of mother: Secondary | ICD-10-CM

## 2021-06-06 DIAGNOSIS — Z362 Encounter for other antenatal screening follow-up: Secondary | ICD-10-CM | POA: Insufficient documentation

## 2021-06-06 DIAGNOSIS — O26899 Other specified pregnancy related conditions, unspecified trimester: Secondary | ICD-10-CM

## 2021-06-06 DIAGNOSIS — O30043 Twin pregnancy, dichorionic/diamniotic, third trimester: Secondary | ICD-10-CM | POA: Insufficient documentation

## 2021-06-06 DIAGNOSIS — Z3A32 32 weeks gestation of pregnancy: Secondary | ICD-10-CM | POA: Diagnosis not present

## 2021-06-06 DIAGNOSIS — O35BXX1 Maternal care for other (suspected) fetal abnormality and damage, fetal cardiac anomalies, fetus 1: Secondary | ICD-10-CM

## 2021-06-06 DIAGNOSIS — O0993 Supervision of high risk pregnancy, unspecified, third trimester: Secondary | ICD-10-CM

## 2021-06-06 DIAGNOSIS — O30023 Conjoined twin pregnancy, third trimester: Secondary | ICD-10-CM

## 2021-06-08 ENCOUNTER — Ambulatory Visit (INDEPENDENT_AMBULATORY_CARE_PROVIDER_SITE_OTHER): Payer: No Typology Code available for payment source | Admitting: Obstetrics & Gynecology

## 2021-06-08 ENCOUNTER — Other Ambulatory Visit

## 2021-06-08 VITALS — BP 111/66 | HR 88 | Wt 169.4 lb

## 2021-06-08 DIAGNOSIS — O0993 Supervision of high risk pregnancy, unspecified, third trimester: Secondary | ICD-10-CM | POA: Diagnosis not present

## 2021-06-08 DIAGNOSIS — Z3A32 32 weeks gestation of pregnancy: Secondary | ICD-10-CM | POA: Diagnosis not present

## 2021-06-08 DIAGNOSIS — O26893 Other specified pregnancy related conditions, third trimester: Secondary | ICD-10-CM | POA: Diagnosis not present

## 2021-06-08 DIAGNOSIS — Z6791 Unspecified blood type, Rh negative: Secondary | ICD-10-CM | POA: Diagnosis not present

## 2021-06-08 DIAGNOSIS — Z23 Encounter for immunization: Secondary | ICD-10-CM

## 2021-06-08 DIAGNOSIS — O30043 Twin pregnancy, dichorionic/diamniotic, third trimester: Secondary | ICD-10-CM

## 2021-06-08 LAB — POCT URINALYSIS DIPSTICK OB
Glucose, UA: NEGATIVE
Ketones, UA: NEGATIVE
Nitrite, UA: NEGATIVE
POC,PROTEIN,UA: NEGATIVE

## 2021-06-08 NOTE — Progress Notes (Signed)
? ?HIGH-RISK PREGNANCY VISIT ?Patient name: Tami Velasquez MRN [id number]  Date of birth: 04-Jan-1991 ?Chief Complaint:   ?High Risk Gestation (Rhogam today) ? ?History of Present Illness:   ?Tami Velasquez is a 31 y.o. G28P2002 female at [redacted]w[redacted]d with an Estimated Date of Delivery: 07/28/21 being seen today for ongoing management of a high-risk pregnancy complicated by: ?DC/DA twins ?Twin A with ventrigulomegaly ?-followed by MFM ?-seen by neurosurgery.   ? ?Today she reports no complaints.  ? ?Contractions: Irritability. Vag. Bleeding: None.  Movement: Present. denies leaking of fluid.  ? ? ?  05/07/2021  ?  8:57 AM 01/25/2021  ?  8:43 AM  ?Depression screen PHQ 2/9  ?Decreased Interest 0 1  ?Down, Depressed, Hopeless 0 0  ?PHQ - 2 Score 0 1  ?Altered sleeping 1 1  ?Tired, decreased energy 1 1  ?Change in appetite 0 1  ?Feeling bad or failure about yourself  0 0  ?Trouble concentrating 1 2  ?Moving slowly or fidgety/restless 0 2  ?Suicidal thoughts 0 0  ?PHQ-9 Score 3 8  ? ? ? ?Current Outpatient Medications  ?Medication Instructions  ? Docusate Sodium (COLACE PO) Oral  ? ferrous sulfate 325 mg, Oral, Every other day  ? Prenatal Vit-Fe Fumarate-FA (PRENATAL VITAMIN PO) Oral  ?  ? ?Review of Systems:   ?Pertinent items are noted in HPI ?Denies abnormal vaginal discharge w/ itching/odor/irritation, headaches, visual changes, shortness of breath, chest pain, abdominal pain, severe nausea/vomiting, or problems with urination or bowel movements unless otherwise stated above. ?Pertinent History Reviewed:  ?Reviewed past medical,surgical, social, obstetrical and family history.  ?Reviewed problem list, medications and allergies. ?Physical Assessment:  ? ?Vitals:  ? 06/08/21 1244  ?BP: 111/66  ?Pulse: 88  ?Weight: 169 lb 6.4 oz (76.8 kg)  ?Body mass index is 26.53 kg/m?. ?     ?     Physical Examination:  ? General appearance: alert, well appearing, and in no distress ? Mental status: normal mood, behavior, speech, dress,  motor activity, and thought processes ? Skin: warm & dry  ? Extremities: Edema: Trace  ?  Cardiovascular: normal heart rate noted ? Respiratory: normal respiratory effort, no distress ? Abdomen: gravid, soft, non-tender ? Pelvic: Cervical exam deferred        ? ?Fetal Status: Fetal Heart Rate (bpm): 135/140   Movement: Present   ? ?Fetal Surveillance Testing today: doppler  ? ?Chaperone: N/A   ? ?Results for orders placed or performed in visit on 06/08/21 (from the past 24 hour(s))  ?POC Urinalysis Dipstick OB  ? Collection Time: 06/08/21 12:42 PM  ?Result Value Ref Range  ? Color, UA    ? Clarity, UA    ? Glucose, UA Negative Negative  ? Bilirubin, UA    ? Ketones, UA neg   ? Spec Grav, UA    ? Blood, UA 1+   ? pH, UA    ? POC,PROTEIN,UA Negative Negative, Trace, Small (1+), Moderate (2+), Large (3+), 4+  ? Urobilinogen, UA    ? Nitrite, UA neg   ? Leukocytes, UA Moderate (2+) (A) Negative  ? Appearance    ? Odor    ?  ? ?Assessment & Plan:  ?High-risk pregnancy: G3P2002 at [redacted]w[redacted]d with an Estimated Date of Delivery: 07/28/21  ? ?1) DC/DA twins with ventriculomegaly of Twin A ?-BPP weekly with MFM @ 32wks ?-will review delivery plan with MFM- considering deliver in [facility] to avoid separation from twin B ? ?2) Contraceptive management ?-  does wish to proceed with tubal ligation ? ?Meds: No orders of the defined types were placed in this encounter. ? ?Labs/procedures today: RhoGAM ? ?Treatment Plan:  as outlined above ? ?Reviewed: Preterm labor symptoms and general obstetric precautions including but not limited to vaginal bleeding, contractions, leaking of fluid and fetal movement were reviewed in detail with the patient.  All questions were answered.  ? ?Follow-up: Return in about 2 weeks (around 06/22/2021) for [place] visit. ? ? ?Future Appointments  ?Date Time Provider [place]  ?06/14/2021  9:15 AM [facility] NURSE [facility]  ?06/14/2021  9:30 AM [facility] US2 [facility]  ?06/20/2021  1:30 PM [facility] NURSE [facility]  WMC  ?06/20/2021  1:45 PM [facility] US5 [facility]  ?06/21/2021  1:50 PM Janyth Pupa, DO [facility]  ? ? ?Orders Placed This Encounter  ?Procedures  ? RHO (D) Immune Globulin  ? Tdap vaccine greater than or equal to 7yo IM  ? POC Urinalysis Dipstick OB  ? ? ?Janyth Pupa, DO ?Attending [place] Practice ?[facility] [place] ? ? ? ?

## 2021-06-11 ENCOUNTER — Other Ambulatory Visit

## 2021-06-11 ENCOUNTER — Encounter: Admitting: Women's Health

## 2021-06-13 ENCOUNTER — Ambulatory Visit

## 2021-06-14 ENCOUNTER — Encounter: Payer: Self-pay | Admitting: *Deleted

## 2021-06-14 ENCOUNTER — Ambulatory Visit: Payer: No Typology Code available for payment source | Admitting: *Deleted

## 2021-06-14 ENCOUNTER — Ambulatory Visit: Payer: No Typology Code available for payment source | Attending: Obstetrics

## 2021-06-14 VITALS — BP 107/62 | HR 81

## 2021-06-14 DIAGNOSIS — Z3A33 33 weeks gestation of pregnancy: Secondary | ICD-10-CM | POA: Diagnosis not present

## 2021-06-14 DIAGNOSIS — O26899 Other specified pregnancy related conditions, unspecified trimester: Secondary | ICD-10-CM | POA: Insufficient documentation

## 2021-06-14 DIAGNOSIS — O0993 Supervision of high risk pregnancy, unspecified, third trimester: Secondary | ICD-10-CM

## 2021-06-14 DIAGNOSIS — Z6791 Unspecified blood type, Rh negative: Secondary | ICD-10-CM

## 2021-06-14 DIAGNOSIS — O30043 Twin pregnancy, dichorionic/diamniotic, third trimester: Secondary | ICD-10-CM

## 2021-06-14 DIAGNOSIS — O30023 Conjoined twin pregnancy, third trimester: Secondary | ICD-10-CM | POA: Diagnosis present

## 2021-06-14 DIAGNOSIS — O35BXX1 Maternal care for other (suspected) fetal abnormality and damage, fetal cardiac anomalies, fetus 1: Secondary | ICD-10-CM

## 2021-06-14 DIAGNOSIS — O09293 Supervision of pregnancy with other poor reproductive or obstetric history, third trimester: Secondary | ICD-10-CM | POA: Diagnosis not present

## 2021-06-14 DIAGNOSIS — O283 Abnormal ultrasonic finding on antenatal screening of mother: Secondary | ICD-10-CM | POA: Insufficient documentation

## 2021-06-14 DIAGNOSIS — O3509X1 Maternal care for (suspected) other central nervous system malformation or damage in fetus, fetus 1: Secondary | ICD-10-CM

## 2021-06-19 ENCOUNTER — Other Ambulatory Visit: Payer: Self-pay | Admitting: *Deleted

## 2021-06-19 DIAGNOSIS — O3509X1 Maternal care for (suspected) other central nervous system malformation or damage in fetus, fetus 1: Secondary | ICD-10-CM

## 2021-06-19 DIAGNOSIS — O30043 Twin pregnancy, dichorionic/diamniotic, third trimester: Secondary | ICD-10-CM

## 2021-06-20 ENCOUNTER — Ambulatory Visit: Payer: No Typology Code available for payment source | Attending: Obstetrics

## 2021-06-20 ENCOUNTER — Ambulatory Visit: Payer: No Typology Code available for payment source | Admitting: *Deleted

## 2021-06-20 VITALS — BP 103/67 | HR 90

## 2021-06-20 DIAGNOSIS — O09293 Supervision of pregnancy with other poor reproductive or obstetric history, third trimester: Secondary | ICD-10-CM

## 2021-06-20 DIAGNOSIS — O35BXX1 Maternal care for other (suspected) fetal abnormality and damage, fetal cardiac anomalies, fetus 1: Secondary | ICD-10-CM

## 2021-06-20 DIAGNOSIS — Z6791 Unspecified blood type, Rh negative: Secondary | ICD-10-CM | POA: Insufficient documentation

## 2021-06-20 DIAGNOSIS — O30043 Twin pregnancy, dichorionic/diamniotic, third trimester: Secondary | ICD-10-CM

## 2021-06-20 DIAGNOSIS — O30023 Conjoined twin pregnancy, third trimester: Secondary | ICD-10-CM | POA: Insufficient documentation

## 2021-06-20 DIAGNOSIS — O0993 Supervision of high risk pregnancy, unspecified, third trimester: Secondary | ICD-10-CM | POA: Insufficient documentation

## 2021-06-20 DIAGNOSIS — Z3A34 34 weeks gestation of pregnancy: Secondary | ICD-10-CM

## 2021-06-20 DIAGNOSIS — O26899 Other specified pregnancy related conditions, unspecified trimester: Secondary | ICD-10-CM | POA: Diagnosis present

## 2021-06-20 DIAGNOSIS — O283 Abnormal ultrasonic finding on antenatal screening of mother: Secondary | ICD-10-CM | POA: Diagnosis present

## 2021-06-20 DIAGNOSIS — O3509X1 Maternal care for (suspected) other central nervous system malformation or damage in fetus, fetus 1: Secondary | ICD-10-CM | POA: Insufficient documentation

## 2021-06-21 ENCOUNTER — Encounter: Admitting: Obstetrics & Gynecology

## 2021-06-27 ENCOUNTER — Ambulatory Visit: Payer: No Typology Code available for payment source | Admitting: *Deleted

## 2021-06-27 ENCOUNTER — Ambulatory Visit: Payer: No Typology Code available for payment source | Attending: Maternal & Fetal Medicine

## 2021-06-27 VITALS — BP 112/68 | HR 83

## 2021-06-27 DIAGNOSIS — O09293 Supervision of pregnancy with other poor reproductive or obstetric history, third trimester: Secondary | ICD-10-CM | POA: Diagnosis not present

## 2021-06-27 DIAGNOSIS — O30043 Twin pregnancy, dichorionic/diamniotic, third trimester: Secondary | ICD-10-CM | POA: Diagnosis not present

## 2021-06-27 DIAGNOSIS — Z3A35 35 weeks gestation of pregnancy: Secondary | ICD-10-CM

## 2021-06-27 DIAGNOSIS — Z6791 Unspecified blood type, Rh negative: Secondary | ICD-10-CM | POA: Insufficient documentation

## 2021-06-27 DIAGNOSIS — O3509X1 Maternal care for (suspected) other central nervous system malformation or damage in fetus, fetus 1: Secondary | ICD-10-CM | POA: Insufficient documentation

## 2021-06-27 DIAGNOSIS — O0993 Supervision of high risk pregnancy, unspecified, third trimester: Secondary | ICD-10-CM

## 2021-06-27 DIAGNOSIS — O26899 Other specified pregnancy related conditions, unspecified trimester: Secondary | ICD-10-CM | POA: Insufficient documentation

## 2021-08-22 ENCOUNTER — Ambulatory Visit: Admitting: Obstetrics & Gynecology

## 2021-08-27 ENCOUNTER — Encounter: Payer: Self-pay | Admitting: Women's Health

## 2021-08-27 ENCOUNTER — Ambulatory Visit (INDEPENDENT_AMBULATORY_CARE_PROVIDER_SITE_OTHER): Payer: No Typology Code available for payment source | Admitting: Women's Health

## 2021-08-27 DIAGNOSIS — Z98891 History of uterine scar from previous surgery: Secondary | ICD-10-CM | POA: Diagnosis not present

## 2021-08-27 DIAGNOSIS — Z30018 Encounter for initial prescription of other contraceptives: Secondary | ICD-10-CM | POA: Diagnosis not present

## 2021-08-27 MED ORDER — PHEXXI 1.8-1-0.4 % VA GEL: 1.0000 | Freq: Once | VAGINAL | 11 refills | Status: AC

## 2021-08-27 NOTE — Patient Instructions (Signed)
Tips To Increase Milk Supply Lots of water! Enough so that your urine is clear Plenty of calories, if you're not getting enough calories, your milk supply can decrease Breastfeed/pump often, every 2-3 hours x 20-30mins Fenugreek 3 pills 3 times a day, this may make your urine smell like maple syrup Mother's Milk Tea Lactation cookies, google for the recipe Real oatmeal Body Armor sports drinks Greater Than hydration drink  

## 2021-08-27 NOTE — Progress Notes (Signed)
POSTPARTUM VISIT Patient name: Tami Velasquez MRN [id number]  Date of birth: 1990-07-09 Chief Complaint:   Postpartum Care (C-Section)  History of Present Illness:   Tami Velasquez is a 31 y.o. G81P2002 Caucasian female being seen today for a postpartum visit. She is 7 weeks postpartum following a primary cesarean section, low transverse incision at 37.2 gestational weeks at [facility] d/t twin pregnancy, baby A breech w/ severe ventriculomegaly. IOL: no, for n/a. Anesthesia: spinal.  Laceration: n/a.  Complications: PPH 2L, received 1u PRBC for anemia (9.1 to 7.6). Inpatient contraception: no.   Pregnancy complicated by twin pregnancy . Tobacco use: no. Substance use disorder: no. Last pap smear: 10/27/20 and results were negative per pt report at [facility] . Next pap smear due: 2025 Patient's last menstrual period was 10/21/2020 (approximate).  Postpartum course has been uncomplicated. Bleeding scant staining. Bowel function is normal. Bladder function is normal. Urinary incontinence? no, fecal incontinence? no Patient is not sexually active. Last sexual activity: prior to birth of baby. Desired contraception:  husband plans vasectomy, discussed interval options w/ pt- wants Phexxi . Patient does not want a pregnancy in the future.  Desired family size is 4 children.   Upstream - 08/27/21 1145       Pregnancy Intention Screening   Does the patient want to become pregnant in the next year? No    Does the patient's partner want to become pregnant in the next year? No    Would the patient like to discuss contraceptive options today? No      Contraception Wrap Up   Current Method Abstinence    End Method Abstinence    Contraception Counseling Provided No            The pregnancy intention screening data noted above was reviewed. Potential methods of contraception were discussed. The patient elected to proceed with Abstinence.  Edinburgh Postpartum Depression Screening: negative   Edinburgh Postnatal Depression Scale - 08/27/21 1141       Edinburgh Postnatal Depression Scale:  In the Past 7 Days   I have been able to laugh and see the funny side of things. 0    I have looked forward with enjoyment to things. 0    I have blamed myself unnecessarily when things went wrong. 2    I have been anxious or worried for no good reason. 1    I have felt scared or panicky for no good reason. 0    Things have been getting on top of me. 1    I have been so unhappy that I have had difficulty sleeping. 0    I have felt sad or miserable. 0    I have been so unhappy that I have been crying. 0    The thought of harming myself has occurred to me. 0    Edinburgh Postnatal Depression Scale Total 4                05/07/2021    8:57 AM 01/25/2021    8:44 AM  GAD 7 : Generalized Anxiety Score  Nervous, Anxious, on Edge 1 2  Control/stop worrying 0 0  Worry too much - different things 1 1  Trouble relaxing 1 0  Restless 0 0  Easily annoyed or irritable 1 2  Afraid - awful might happen 0 0  Total GAD 7 Score 4 5     Baby A's course has been complicated by ventriculomegaly, has shunt .  Baby B's  course has been uncomplicated. Baby's are feeding by breast: milk supply adequate. Infant has a pediatrician/family doctor? Yes.  Childcare strategy if returning to work/school: family.  Pt has material needs met for her and baby: Yes.   Review of Systems:   Pertinent items are noted in HPI Denies Abnormal vaginal discharge w/ itching/odor/irritation, headaches, visual changes, shortness of breath, chest pain, abdominal pain, severe nausea/vomiting, or problems with urination or bowel movements. Pertinent History Reviewed:  Reviewed past medical,surgical, obstetrical and family history.  Reviewed problem list, medications and allergies. OB History  Gravida Para Term Preterm AB Living  4 3 3  0 0 4  SAB IAB Ectopic Multiple Live Births  0 0 0 1 4    # Outcome Date GA Lbr Len/2nd  Weight Sex Delivery Anes PTL Lv  4A Term 07/09/21 [redacted]w[redacted]d  M CS-LTranv Spinal  LIV     Birth Comments: breech, ventriculomegaly  4B Term 07/09/21 343w2d F CS-LTranv Spinal  LIV     Complications: Postpartum hemorrhage  3 Term 12/12/18 4083w2d lb (3.629 kg) M Vag-Vacuum EPI N LIV     Birth Comments: sounds like vacuum for maternal pushing effort/ was only on for a few seconds  2 Term 08/21/15 39w60w1dlb 2.1 oz (3.235 kg) F Vag-Spont EPI N LIV     Complications: Gastroschisis  1 Gravida            Physical Assessment:   Vitals:   08/27/21 1129  BP: 107/77  Pulse: 82  Weight: 139 lb (63 kg)  Height: 5' 7"  (1.702 m)  Body mass index is 21.77 kg/m.       Physical Examination:   General appearance: alert, well appearing, and in no distress  Mental status: alert, oriented to person, place, and time  Skin: warm & dry   Cardiovascular: normal heart rate noted   Respiratory: normal respiratory effort, no distress   Breasts: deferred, no complaints   Abdomen: soft, non-tender, c/s incision well healed  Pelvic: examination not indicated. Thin prep pap obtained: No  Rectal: not examined  Extremities: Edema: none   Chaperone: N/A         No results found for this or any previous visit (from the past 24 hour(s)).  Assessment & Plan:  1) Postpartum exam 2) 7 wks s/p primary cesarean section, low transverse incision at [facility] twins, baby A breech w/ severe ventriculomegaly 3) breast feeding 4) Depression screening 5) Contraception management: rx Phexxi to [facility] to use until husband has and is cleared w/ vasectomy  Essential components of care per ACOG recommendations:  1.  Mood and well being:  If positive depression screen, discussed and plan developed.  If using tobacco we discussed reduction/cessation and risk of relapse If current substance abuse, we discussed and referral to local resources was offered.   2. Infant care and feeding:  If breastfeeding, discussed  returning to work, pumping, breastfeeding-associated pain, guidance regarding return to fertility while lactating if not using another method. If needed, patient was provided with a letter to be allowed to pump q 2-3hrs to support lactation in a private location with access to a refrigerator to store breastmilk.   Recommended that all caregivers be immunized for flu, pertussis and other preventable communicable diseases If pt does not have material needs met for her/baby, referred to local resources for help obtaining these.  3. Sexuality, contraception and birth spacing Provided guidance regarding sexuality, management of dyspareunia, and resumption  of intercourse Discussed avoiding interpregnancy interval <74mhs and recommended birth spacing of 18 months  4. Sleep and fatigue Discussed coping options for fatigue and sleep disruption Encouraged family/partner/community support of 4 hrs of uninterrupted sleep to help with mood and fatigue  5. Physical recovery  If pt had a C/S, assessed incisional pain and providing guidance on normal vs prolonged recovery If pt had a laceration, perineal healing and pain reviewed.  If urinary or fecal incontinence, discussed management and referred to PT or uro/gyn if indicated  Patient is safe to resume physical activity. Discussed attainment of healthy weight.  6.  Chronic disease management Discussed pregnancy complications if any, and their implications for future childbearing and long-term maternal health. Review recommendations for prevention of recurrent pregnancy complications, such as 17 hydroxyprogesterone caproate to reduce risk for recurrent PTB not applicable, or aspirin to reduce risk of preeclampsia not applicable. Pt had GDM: no. If yes, 2hr GTT scheduled: not applicable. Reviewed medications and non-pregnant dosing including consideration of whether pt is breastfeeding using a reliable resource such as LactMed: yes Referred for f/u w/ PCP or  subspecialist providers as indicated: not applicable  7. Health maintenance Mammogram at 45yoor earlier if indicated Pap smears as indicated  Meds:  Meds ordered this encounter  Medications   Lactic Ac-Citric Ac-Pot Bitart (PHEXXI) 1.8-1-0.4 % GEL    Sig: Place 1 Applicatorful vaginally once for 1 dose. Up to 1 hour before sex    Dispense:  180 g    Refill:  11    Order Specific Question:   Supervising Provider    Answer:   EFlorian Buff[[id number]]    Follow-up: Return in about 1 year (around 08/28/2022) for Pap & physical.   No orders of the defined types were placed in this encounter.   [place] [facility]7/31/2023 12:31 PM

## 2021-09-10 ENCOUNTER — Encounter: Payer: Self-pay | Admitting: *Deleted

## 2022-04-01 ENCOUNTER — Ambulatory Visit
Admission: RE | Admit: 2022-04-01 | Discharge: 2022-04-01 | Disposition: A | Discharge status: Home / Self Care | Payer: No Typology Code available for payment source | Source: Ambulatory Visit | Attending: Family Medicine | Admitting: Family Medicine

## 2022-04-01 VITALS — BP 114/60 | HR 74 | Temp 97.6°F | Resp 20

## 2022-04-01 DIAGNOSIS — H109 Unspecified conjunctivitis: Secondary | ICD-10-CM | POA: Diagnosis not present

## 2022-04-01 MED ORDER — POLYMYXIN B-TRIMETHOPRIM 10000-0.1 UNIT/ML-% OP SOLN
1.0000 [drp] | Freq: Four times a day (QID) | OPHTHALMIC | 0 refills | Status: AC
Start: 2022-04-01 — End: ?

## 2022-04-01 NOTE — ED Triage Notes (Signed)
Pt reports her left eye is pink and itchy x 2 days ago.

## 2022-04-01 NOTE — ED Provider Notes (Signed)
[facility]    CSN: [id number] Arrival date & time: 04/01/22  1322      History   Chief Complaint Chief Complaint  Patient presents with   Conjunctivitis    Entered by patient    HPI Tami Velasquez is a 32 y.o. female.   Patient presenting today with 2-day history of left eye redness, itchiness, crusting.  Denies headache, injury to the eye, congestion, cough, fever, chills, nausea, vomiting.  Daughter has been dealing with similar symptoms for the past week or so.  Not trying anything over-the-counter for symptoms.    Past Medical History:  Diagnosis Date   ADHD    HSV-1 (herpes simplex virus 1) infection    Vaginal Pap smear, abnormal     Patient Active Problem List   Diagnosis Date Noted   H/O cesarean section 08/27/2021   Rh negative state in antepartum period 01/25/2021   Bilateral ovarian cysts 01/25/2021   Attention deficit hyperactivity disorder (ADHD), combined type 02/28/2014    Past Surgical History:  Procedure Laterality Date   WISDOM TOOTH EXTRACTION      OB History     Gravida  4   Para  3   Term  3   Preterm  0   AB  0   Living  4      SAB  0   IAB  0   Ectopic  0   Multiple  1   Live Births  4            Home Medications    Prior to Admission medications   Medication Sig Start Date End Date Taking? Authorizing Provider  trimethoprim-polymyxin b (POLYTRIM) ophthalmic solution Place 1 drop into both eyes every 6 (six) hours. 04/01/22  Yes Volney American, PA-C  Docusate Sodium (COLACE PO) Take by mouth. Patient not taking: Reported on 08/27/2021    [provider]  ferrous sulfate 325 (65 FE) MG tablet Take 1 tablet (325 mg total) by mouth every other day. Patient not taking: Reported on 08/27/2021 05/08/21   Roma Schanz, CNM  Prenatal Vit-Fe Fumarate-FA (PRENATAL VITAMIN PO) Take by mouth.    [provider]    Family History Family History  Problem Relation Age of Onset    Cerebral palsy Sister    Stroke Paternal Aunt    Cancer Maternal Grandfather    Hypertension Maternal Grandfather    Heart disease Paternal Grandfather    Cancer Paternal Grandfather     Social History Social History   Tobacco Use   Smoking status: Never  Vaping Use   Vaping Use: Never used  Substance Use Topics   Alcohol use: No   Drug use: No     Allergies   Zithromax [azithromycin]   Review of Systems Review of Systems PER HPI  Physical Exam Triage Vital Signs ED Triage Vitals  Enc Vitals Group     BP 04/01/22 1414 114/60     Pulse Rate 04/01/22 1414 74     Resp 04/01/22 1414 20     Temp 04/01/22 1414 97.6 F (36.4 C)     Temp Source 04/01/22 1414 Oral     SpO2 04/01/22 1414 98 %     Weight --      Height --      Head Circumference --      Peak Flow --      Pain Score 04/01/22 1417 0     Pain Loc --  Pain Edu? --      Excl. in [place]? --    No data found.  Updated Vital Signs BP 114/60 (BP Location: Right Arm)   Pulse 74   Temp 97.6 F (36.4 C) (Oral)   Resp 20   SpO2 98%   Breastfeeding Yes   Visual Acuity Right Eye Distance:   Left Eye Distance:   Bilateral Distance:    Right Eye Near:   Left Eye Near:    Bilateral Near:     Physical Exam Vitals and nursing note reviewed.  Constitutional:      Appearance: Normal appearance. She is not ill-appearing.  HENT:     Head: Atraumatic.  Eyes:     Extraocular Movements: Extraocular movements intact.     Pupils: Pupils are equal, round, and reactive to light.     Comments: Injection of mild erythema of the left conjunctiva.  Cardiovascular:     Rate and Rhythm: Normal rate and regular rhythm.     Heart sounds: Normal heart sounds.  Pulmonary:     Effort: Pulmonary effort is normal.     Breath sounds: Normal breath sounds.  Musculoskeletal:        General: Normal range of motion.     Cervical back: Normal range of motion and neck supple.  Skin:    General: Skin is warm and dry.   Neurological:     Mental Status: She is alert and oriented to person, place, and time.  Psychiatric:        Mood and Affect: Mood normal.        Thought Content: Thought content normal.        Judgment: Judgment normal.      UC Treatments / Results  Labs (all labs ordered are listed, but only abnormal results are displayed) Labs Reviewed - No data to display  EKG   Radiology No results found.  Procedures Procedures (including critical care time)  Medications Ordered in UC Medications - No data to display  Initial Impression / Assessment and Plan / UC Course  I have reviewed the triage vital signs and the nursing notes.  Pertinent labs & imaging results that were available during my care of the patient were reviewed by me and considered in my medical decision making (see chart for details).     Visual acuity deferred today as patient states vision is intact.  Will treat with Polytrim drops, warm compresses, good hand hygiene and follow-up for worsening symptoms.  Final Clinical Impressions(s) / UC Diagnoses   Final diagnoses:  Conjunctivitis of left eye, unspecified conjunctivitis type   Discharge Instructions   None    ED Prescriptions     Medication Sig Dispense Auth. Provider   trimethoprim-polymyxin b (POLYTRIM) ophthalmic solution Place 1 drop into both eyes every 6 (six) hours. 10 mL Volney American, [place]      PDMP not reviewed this encounter.   Volney American, [place] 04/01/22 1505

## 2023-04-19 IMAGING — US US MFM OB FOLLOW-UP
1 series · 14 of 28 positions shown · non-contrast
Comparison: none

[Series 1: us mfm ob follow-up · 133 acquisitions, 14 frames shown]
[im 5/133]
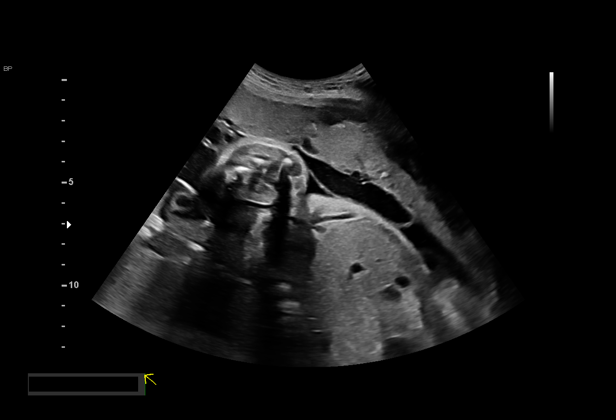
[im 15/133]
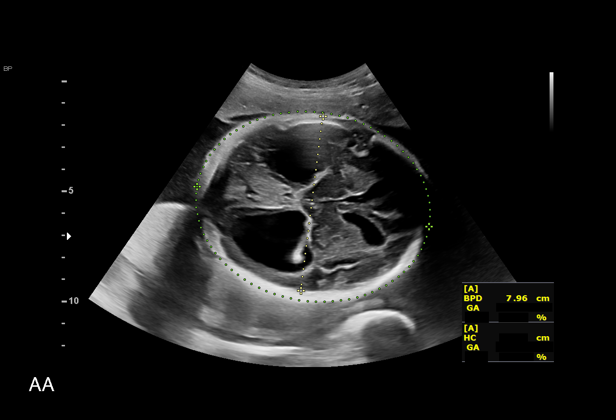
[im 25/133]
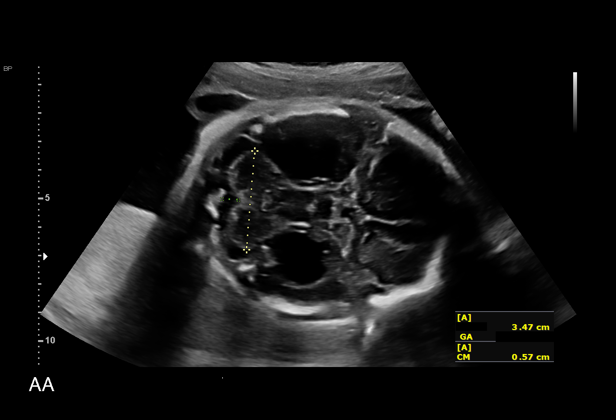
[im 35/133]
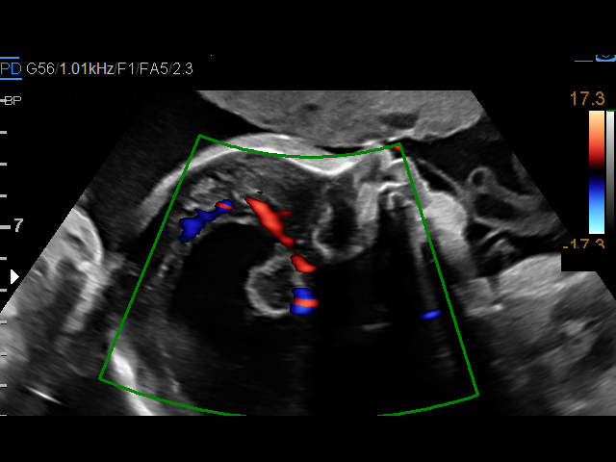
[im 45/133]
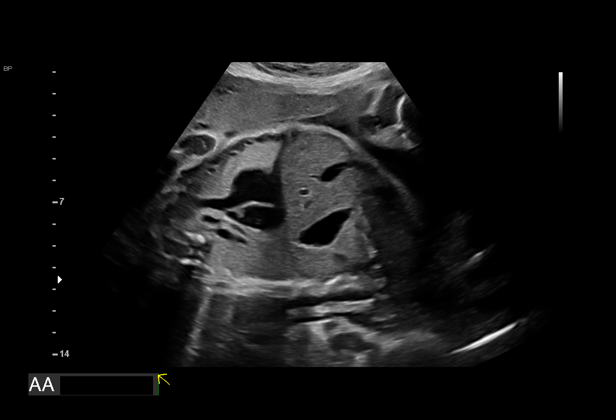
[im 54/133]
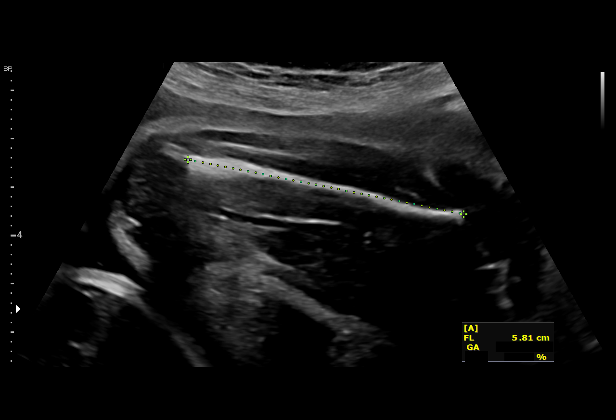
[im 64/133]
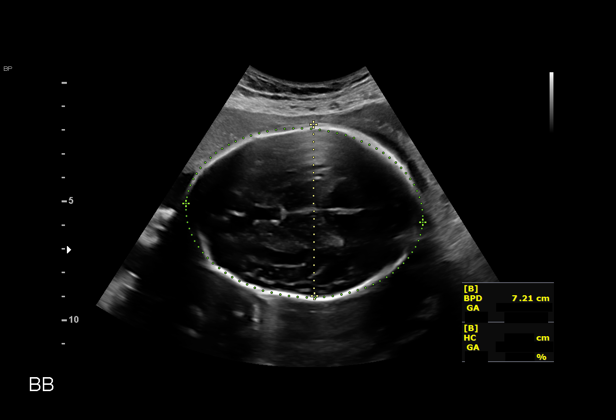
[im 74/133]
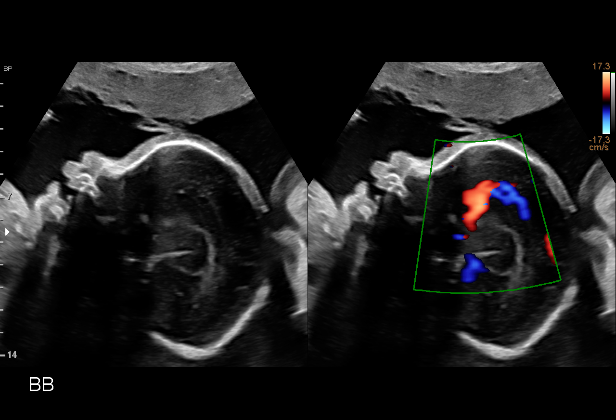
[im 84/133]
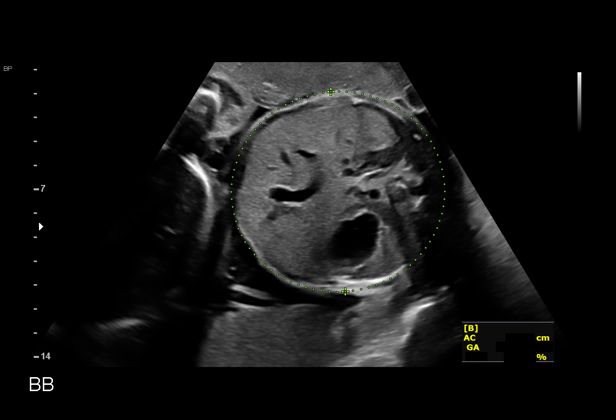
[im 93/133]
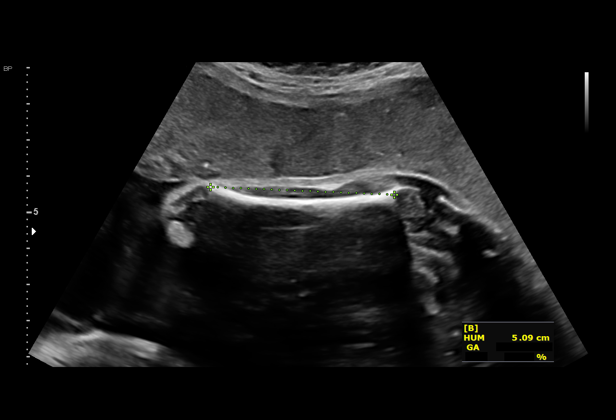
[im 103/133]
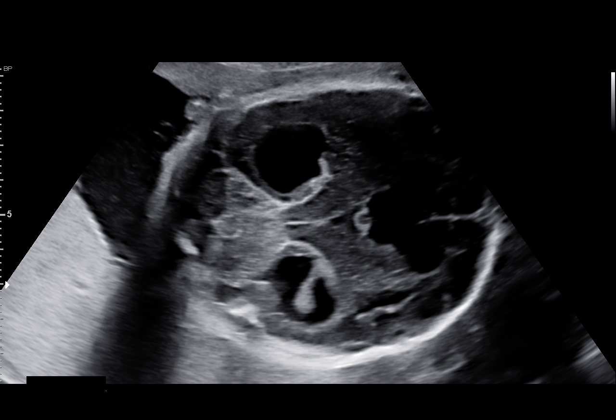
[im 113/133]
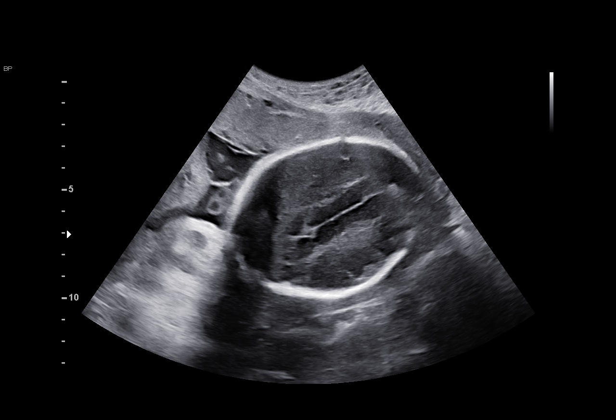
[im 123/133]
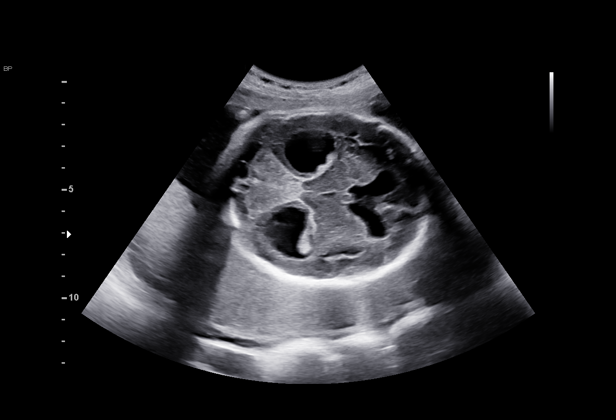
[im 133/133]
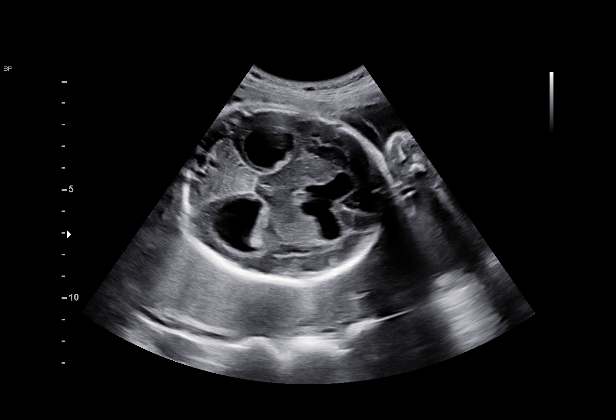

[14 of 28 positions shown; findings below may reference images not displayed]

GYN
                   DATTA DO

    GEST

Indications

 Twin pregnancy, di/di, third trimester
 Cerebral ventriculomegaly (twin A)
 Fetal abnormality - other known or suspected
 Poor obstetrical history - prev child with
 gastroschisis
 LR NIPS
 29 weeks gestation of pregnancy
Fetal Evaluation (Fetus A)

 Num Of Fetuses:         2
 Fetal Heart Rate(bpm):  127
 Cardiac Activity:       Observed
 Fetal Lie:              Maternal right side
 Presentation:           Breech
 Placenta:               Posterior
 P. Cord Insertion:      Previously Visualized
 Membrane Desc:      Dividing Membrane seen - Dichorionic.

 Amniotic Fluid
 AFI FV:      Within normal limits

                             Largest Pocket(cm)

Biometry (Fetus A)
 BPD:      80.3  mm     G. Age:  32w 2d         97  %    CI:        69.14   %    70 - 86
                                                         FL/HC:      18.9   %    19.2 -
 HC:      308.4  mm     G. Age:  34w 3d       > 99  %    HC/AC:      1.18        0.99 -
 AC:      260.3  mm     G. Age:  30w 1d         63  %    FL/BPD:     72.6   %    71 - 87
 FL:       58.3  mm     G. Age:  30w 3d         61  %    FL/AC:      22.4   %    20 - 24
 HUM:      52.5  mm     G. Age:  30w 4d         69  %
 CER:      34.7  mm     G. Age:  29w 2d         33  %

 LV:       26.4  mm
 CM:        5.7  mm

 Est. FW:    0349  gm    3 lb 10 oz      81  %     FW Discordancy      0 \ 6 %
OB History

 Gravidity:    3         Term:   2
 Living:       2
Gestational Age (Fetus A)

 LMP:           29w 4d        Date:  10/21/20                 EDD:   07/28/21
 U/S Today:     31w 6d                                        EDD:   07/12/21
 Best:          29w 4d     Det. By:  LMP  (10/21/20)          EDD:   07/28/21
Anatomy (Fetus A)

 Cranium:               Appears normal         Aortic Arch:            Previously seen
 Cavum:                 Not well visualized    Ductal Arch:            Previously seen
 Ventricles:            Bilat.ventriculomeg    Diaphragm:              Appears normal
                        Sena
 Choroid Plexus:        Dangling Choroid       Stomach:                Appears normal, left
                                                                       sided
 Cerebellum:            Appears normal         Abdomen:                Previously seen
 Posterior Fossa:       Appears normal         Abdominal Wall:         Previously seen
 Nuchal Fold:           Not applicable (>20    Cord Vessels:           Appears normal (3
                        wks GA)                                        vessel cord)
 Face:                  Appears normal         Kidneys:                Appear normal
                        (orbits and profile)
 Lips:                  Appears normal         Bladder:                Appears normal
 Thoracic:              Appears normal         Spine:                  Previously seen
 Heart:                 Previously seen        Upper Extremities:      Previously seen
 RVOT:                  Previously seen        Lower Extremities:      Previously seen
 LVOT:                  Previously seen

 Other:  Male gender previously seen. 3VV and 3VTV previously visualized.
         Technically difficult due to fetal position.

Fetal Evaluation (Fetus B)

 Num Of Fetuses:         2
 Fetal Heart Rate(bpm):  141
 Cardiac Activity:       Observed
 Fetal Lie:              Maternal left side
 Presentation:           Cephalic
 Placenta:               Anterior
 P. Cord Insertion:      Previously Visualized
 Membrane Desc:      Dividing Membrane seen - Dichorionic.
 Amniotic Fluid
 AFI FV:      Within normal limits

                             Largest Pocket(cm)

Biometry (Fetus B)

 BPD:      72.1  mm     G. Age:  29w 0d         20  %    CI:        71.28   %    70 - 86
                                                         FL/HC:      20.3   %    19.2 -
 HC:       272   mm     G. Age:  29w 5d         19  %    HC/AC:      1.00        0.99 -
 AC:      273.1  mm     G. Age:  31w 3d         90  %    FL/BPD:     76.6   %    71 - 87
 FL:       55.2  mm     G. Age:  29w 1d         23  %    FL/AC:      20.2   %    20 - 24
 HUM:      51.1  mm     G. Age:  30w 0d         55  %
 CER:      37.3  mm     G. Age:  30w 5d         77  %

 LV:        2.2  mm
 CM:        7.3  mm

 Est. FW:    0884  gm      3 lb 7 oz     64  %     FW Discordancy         6  %
Gestational Age (Fetus B)

 LMP:           29w 4d        Date:  10/21/20                 EDD:   07/28/21
 U/S Today:     29w 6d                                        EDD:   07/26/21
 Best:          29w 4d     Det. By:  LMP  (10/21/20)          EDD:   07/28/21
Anatomy (Fetus B)

 Cranium:               Appears normal         Aortic Arch:            Appears normal
 Cavum:                 Appears normal         Ductal Arch:            Previously seen
 Ventricles:            Appears normal         Diaphragm:              Appears normal
 Choroid Plexus:        Previously seen        Stomach:                Appears normal, left
                                                                       sided
 Cerebellum:            Appears normal         Abdomen:                Previously seen
 Posterior Fossa:       Appears normal         Abdominal Wall:         Previously seen
 Nuchal Fold:           Not applicable (>20    Cord Vessels:           Appears normal (3
                        wks GA)                                        vessel cord)
 Face:                  Profile nl; orbits     Kidneys:                Appear normal
                        prev visualized
 Lips:                  Appears normal         Bladder:                Appears normal
 Thoracic:              Appears normal         Spine:                  Previously seen
 Heart:                 Appears normal         Upper Extremities:      Not well visualized
                        (4CH, axis, and
                        situs)
 RVOT:                  Previously seen        Lower Extremities:      Previously seen
 LVOT:                  Previously seen

 Other:  Female gender previously seen. VC visualized. VC, 3VV and 3VTV
         previously visualized. Technically difficult due to fetal position.
Cervix Uterus Adnexa

 Cervix
 Not visualized (advanced GA >64wks)

 Uterus
 Normal shape and size.
 Right Ovary
 Not visualized.

 Left Ovary
 Not visualized.
Comments

 Cedrick Canato was seen for a follow up exam due to a
 dichorionic, diamniotic twin gestation where severe bilateral
 ventriculomegaly was noted in twin A's brain.  She had a fetal
 MRI performed at [facility] that showed moderate
 supratentorial ventriculomegaly in twin A.  The corpus
 callosum is present.  There is normal cerebral morphology
 and pattern of sulcation.  The cerebellum and brainstem also
 appear normal in twin A.  Her CMV and toxoplasmosis testing
 did not indicate an acute infection.
 Her cell free DNA test indicated a low risk for trisomy 21, 18,
 and 13.  A male and female fetus are predicted.  These are
 dizygotic twins.
 She was informed that the fetal growth and amniotic fluid
 level appears appropriate for her gestational age for both twin
 A and twin B.
 Severe bilateral ventriculomegaly continues to be noted in
 twin A's brain.  The patient was advised that the cause of the
 ventriculomegaly in twin A's brain remains undetermined as
 her MRI showed normal cerebral morphology, normal
 cerebellum and brainstem, and the corpus callosum is
 present.

 She was advised that twin A will most likely require shunt
 placement after birth to treat the severe ventriculomegaly.  I
 will make a referral for a prenatal consultation to pediatric
 [HOSPITAL] [facility] to discuss the management
 and the most optimal institution for delivery.   Hopefully, her
 baby will have a normal neurologic outcome after a shunt is
 placed after birth.

 I will also present her case at a future [employer] meeting.

 Should the pediatric neurosurgeons or the NICU recommend
 delivery at [HOSPITAL], we will help her make arrangements to
 transfer her care later in pregnancy.

 The patient has declined an amniocentesis for definitive
 diagnosis of fetal aneuploidy.

 She has a fetal echocardiogram already scheduled with [HOSPITAL]
 pediatric cardiology on May 24, 2021.

 Due to severe ventriculomegaly in twin A, we will start weekly
 BPP's at 32 weeks.

 She will return in 3 weeks for a BPP.

 The patient stated that all of her questions had been
 answered today.

 A total of 20 minutes was spent counseling and coordinating
 the care for this patient.  Greater than 50% of the time was
 spent in direct face-to-face contact.

## 2023-05-18 IMAGING — US US MFM FETAL BPP W/O NON-STRESS
1 series · 12 of 28 positions shown · non-contrast
Comparison: none

[Series 1: us mfm fetal bpp w/o non-stress · 44 acquisitions, 12 frames shown]
[im 2/44]
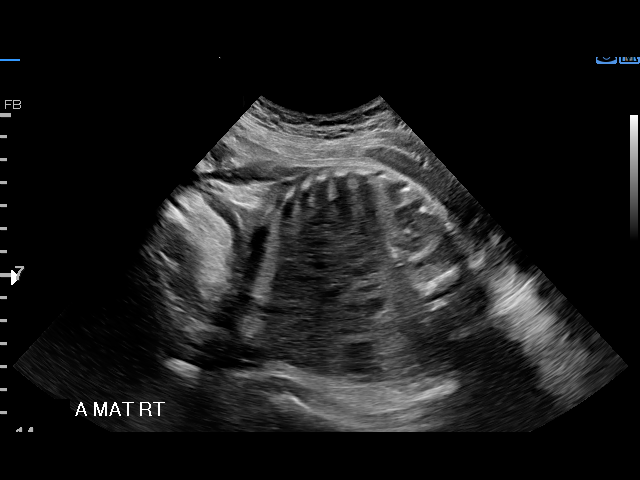
[im 5/44]
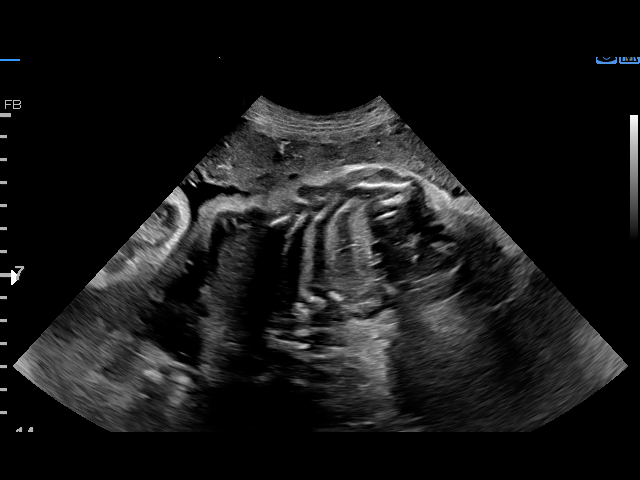
[im 8/44]
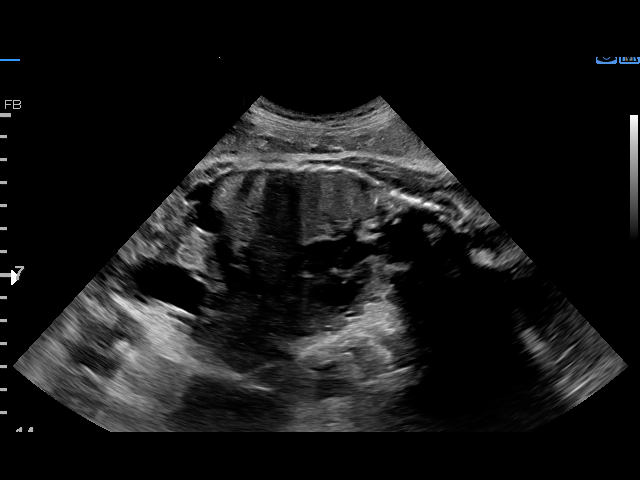
[im 13/44]
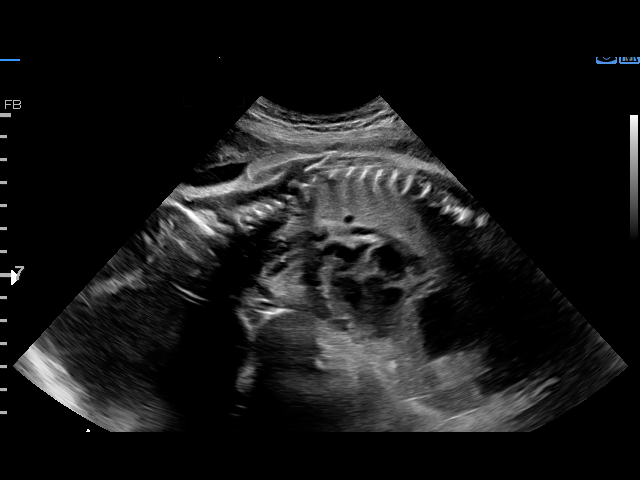
[im 16/44]
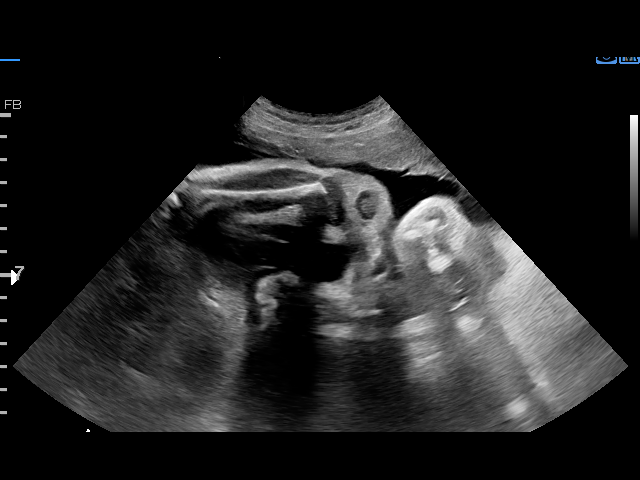
[im 20/44]
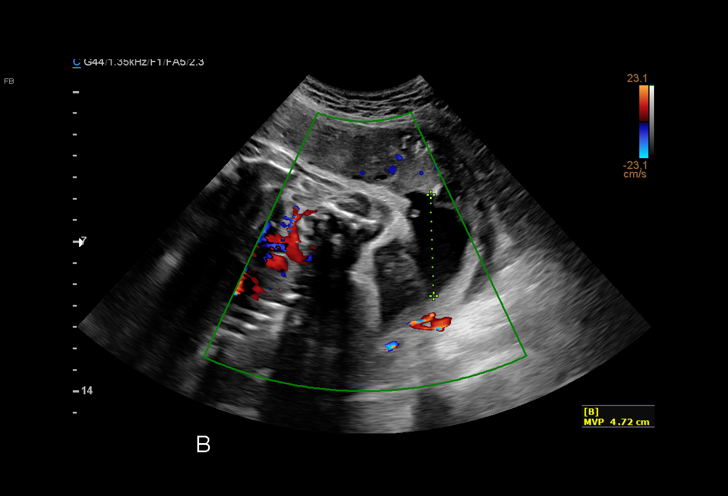
[im 24/44]
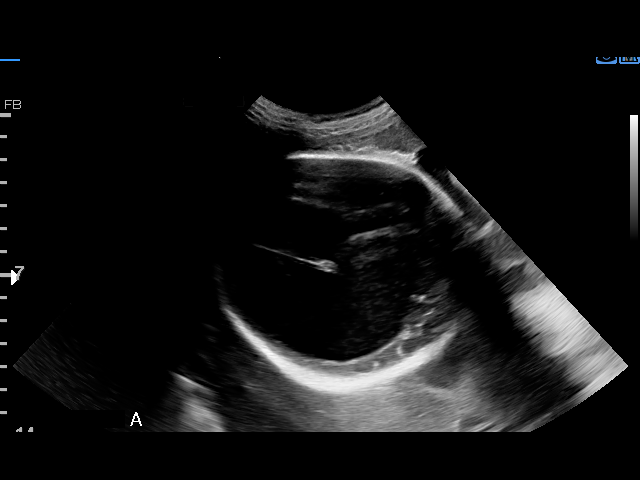
[im 28/44]
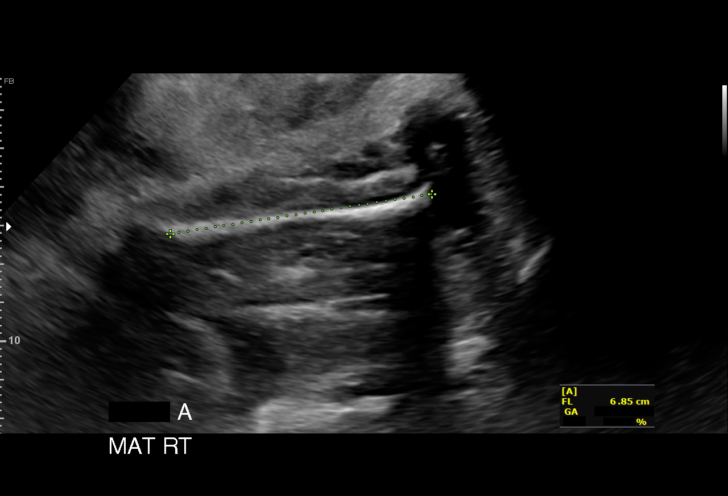
[im 31/44]
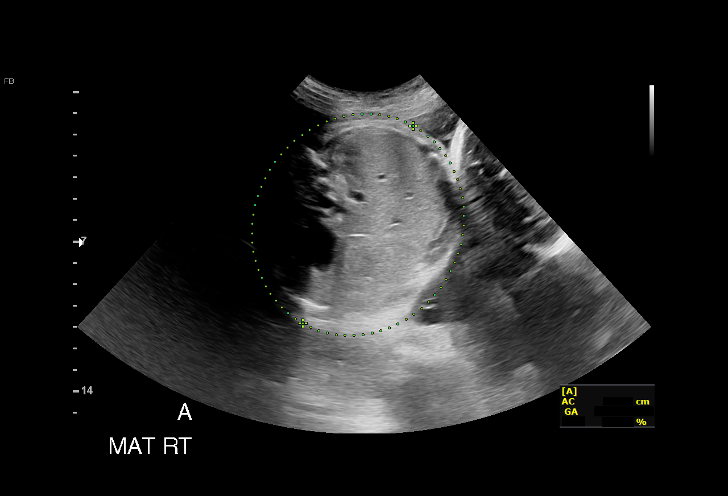
[im 36/44]
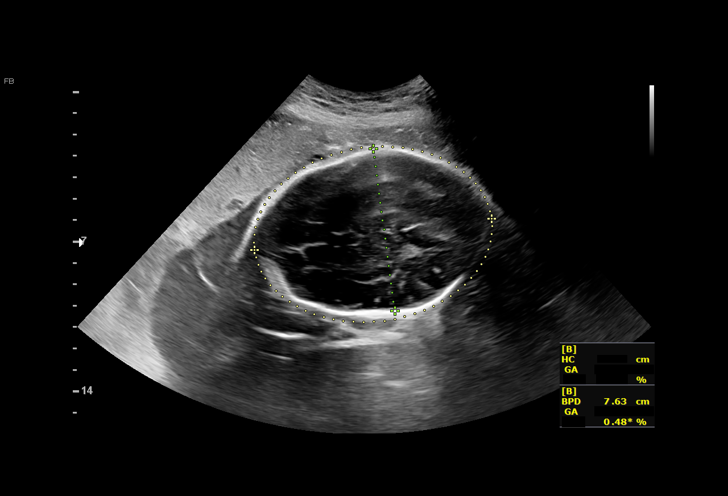
[im 39/44]
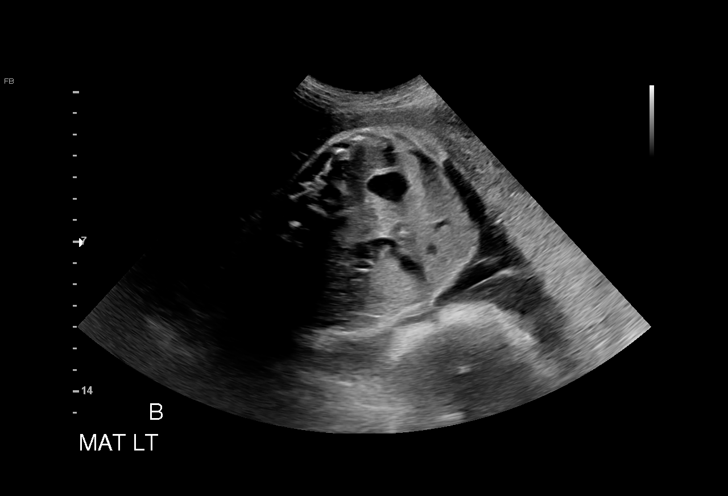
[im 42/44]
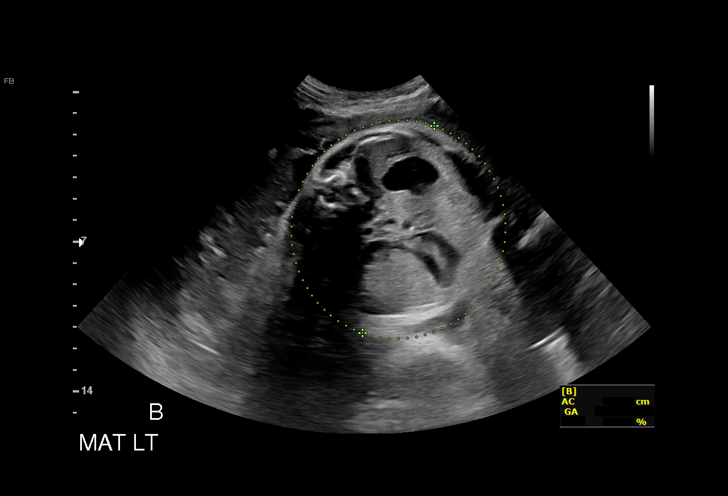

[12 of 28 positions shown; findings below may reference images not displayed]

GYN
                   RATPI DO

    ADDL GESTATION
    GEST

Indications

 Twin pregnancy, di/di, third trimester
 Cerebral ventriculomegaly (twin A)
 Fetal abnormality - other known or suspected
 Poor obstetrical history - prev child with
 gastroschisis
 33 weeks gestation of pregnancy
 LR NIPS
Fetal Evaluation (Fetus A)

 Num Of Fetuses:         2
 Fetal Heart Rate(bpm):  148
 Cardiac Activity:       Observed
 Fetal Lie:              Maternal right side
 Presentation:           Breech
 Placenta:               Posterior
 P. Cord Insertion:      Previously Visualized
 Membrane Desc:      Dividing Membrane seen - Dichorionic.
 Amniotic Fluid
 AFI FV:      Within normal limits

                             Largest Pocket(cm)

Biophysical Evaluation (Fetus A)

 Amniotic F.V:   Pocket => 2 cm             F. Tone:        Observed
 F. Movement:    Observed                   Score:          [DATE]
 F. Breathing:   Observed
Biometry (Fetus A)

 BPD:     102.3  mm     G. Age:  42w 2d       > 99  %    CI:        80.19   %    70 - 86
                                                         FL/HC:      19.0   %    19.4 -
 HC:      360.9  mm     G. Age:  42w 4d       > 99  %    HC/AC:      1.15        0.96 -
 AC:       314   mm     G. Age:  35w 2d         91  %    FL/BPD:     66.9   %    71 - 87
 FL:       68.4  mm     G. Age:  35w 1d         77  %    FL/AC:      21.8   %    20 - 24

 Est. FW:    3323  gm    6 lb 10 oz    > 99  %     FW Discordancy     0 \ 18 %
OB History

 Gravidity:    3         Term:   2
 Living:       2
Gestational Age (Fetus A)

 LMP:           33w 5d        Date:  10/21/20                 EDD:   07/28/21
 U/S Today:     38w 6d                                        EDD:   06/22/21
 Best:          33w 5d     Det. By:  LMP  (10/21/20)          EDD:   07/28/21
Anatomy (Fetus A)

 Cranium:               Appears normal         Aortic Arch:            Previously seen
 Cavum:                 Not well visualized    Ductal Arch:            Previously seen
 Ventricles:            Bilat.ventriculomeg    Diaphragm:              Appears normal
                        Bodily
 Choroid Plexus:        Dangling Choroid       Stomach:                Appears normal, left
                                                                       sided
 Cerebellum:            Previously seen        Abdomen:                Appears normal
 Posterior Fossa:       Previously seen        Abdominal Wall:         Previously seen
 Nuchal Fold:           Not applicable (>20    Cord Vessels:           Appears normal (3
                        wks GA)                                        vessel cord)
 Face:                  Orbits and profile     Kidneys:                Appear normal
                        previously seen
 Lips:                  Previously seen        Bladder:                Appears normal
 Thoracic:              Appears normal         Spine:                  Previously seen
 Heart:                 Previously seen        Upper Extremities:      Previously seen
 RVOT:                  Previously seen        Lower Extremities:      Previously seen
 LVOT:                  Previously seen

 Other:  Male gender previously seen. 3VV and 3VTV previously visualized.
         Technically difficult due to fetal position.
Fetal Evaluation (Fetus B)
 Num Of Fetuses:         2
 Fetal Heart Rate(bpm):  147
 Cardiac Activity:       Observed
 Fetal Lie:              Maternal right side
 Presentation:           Transverse, head to maternal left
 Placenta:               Anterior
 P. Cord Insertion:      Previously Visualized
 Membrane Desc:      Dividing Membrane seen - Dichorionic.

 Amniotic Fluid
 AFI FV:      Within normal limits

                             Largest Pocket(cm)

Biophysical Evaluation (Fetus B)

 Amniotic F.V:   Pocket => 2 cm             F. Tone:        Observed
 F. Movement:    Observed                   Score:          [DATE]
 F. Breathing:   Observed
Biometry (Fetus B)

 BPD:      77.8  mm     G. Age:  31w 2d        1.8  %    CI:        66.18   %    70 - 86
                                                         FL/HC:      22.7   %    19.4 -
 HC:      306.7  mm     G. Age:  34w 1d         25  %    HC/AC:      1.00        0.96 -
 AC:       308   mm     G. Age:  34w 5d         81  %    FL/BPD:     89.6   %    71 - 87
 FL:       69.7  mm     G. Age:  35w 5d         88  %    FL/AC:      22.6   %    20 - 24

 Est. FW:    2564  gm      5 lb 7 oz     70  %     FW Discordancy        18  %
Gestational Age (Fetus B)

 LMP:           33w 5d        Date:  10/21/20                 EDD:   07/28/21
 U/S Today:     34w 0d                                        EDD:   07/26/21
 Best:          33w 5d     Det. By:  LMP  (10/21/20)          EDD:   07/28/21
Anatomy (Fetus B)

 Cranium:               Appears normal         Aortic Arch:            Previously seen
 Cavum:                 Appears normal         Ductal Arch:            Previously seen
 Ventricles:            Previously seen        Diaphragm:              Appears normal
 Choroid Plexus:        Previously seen        Stomach:                Appears normal, left
                                                                       sided
 Cerebellum:            Previously seen        Abdomen:                Appears normal
 Posterior Fossa:       Previously seen        Abdominal Wall:         Previously seen
 Nuchal Fold:           Not applicable (>20    Cord Vessels:           Previously seen
                        wks GA)
 Face:                  Orbits and profile     Kidneys:                Appear normal
                        previously seen
 Lips:                  Previously seen        Bladder:                Appears normal
 Thoracic:              Appears normal         Spine:                  Previously seen
 Heart:                 Previously seen        Upper Extremities:      Not well visualized
 RVOT:                  Previously seen        Lower Extremities:      Previously seen
 LVOT:                  Previously seen

 Other:  Female gender previously seen. VC, 3VV and 3VTV  previously
         visualized. Technically difficult due to fetal position.
Impression

 Follow up growth and antenatal testing for dichorionic
 diamnoitic twin pregnancy with severe ventriculomegaly in
 Twin A.
 Twin A normal stomach, amniotic fluid, bladder and BPP [DATE]-
 Maternal right, Breech- 5FR-XX%
 Twin B normal stomach, amniotic fluid, bladder and BPP [DATE]-
 Maternal left, Transverse-EFW 70%
 Twin discordance is 18%.

 Twin A has persistent severe ventriculmegaly increased
 stable at ^3 cm.
 She saw neurosurgery [DATE] see not in [REDACTED]- ok for delivery
 and NSVD (pending head size) [REDACTED] with postnatal
 evaluation, however, today we note a BPD 10 cm, which I
 explained will likely require a cesarean delivery (Also
 Breech/TV today).

 Ms. Movil and her husband spoke with her provider and
 discussed further the option of delivery at [HOSPITAL]. We have
 sent the information to [facility] at [HOSPITAL] and the [facility]
 should expect a call early next week from Md Humayun Kabeer [facility].

 All questions answered.
Recommendations

 Continue weekly testing.
 [HOSPITAL] referral for delivery given Twin A ventriculomegaly.

## 2023-08-29 ENCOUNTER — Ambulatory Visit
Admission: RE | Admit: 2023-08-29 | Discharge: 2023-08-29 | Disposition: A | Discharge status: Home / Self Care | Payer: Self-pay | Source: Ambulatory Visit | Attending: Nurse Practitioner | Admitting: Nurse Practitioner

## 2023-08-29 ENCOUNTER — Other Ambulatory Visit: Payer: Self-pay

## 2023-08-29 VITALS — BP 115/81 | HR 70 | Temp 98.5°F | Resp 20

## 2023-08-29 DIAGNOSIS — J039 Acute tonsillitis, unspecified: Secondary | ICD-10-CM | POA: Diagnosis present

## 2023-08-29 LAB — POCT RAPID STREP A (OFFICE): Rapid Strep A Screen: NEGATIVE

## 2023-08-29 MED ORDER — AMOXICILLIN 500 MG PO CAPS: 500.0000 mg | ORAL_CAPSULE | Freq: Two times a day (BID) | ORAL | 0 refills | Status: AC

## 2023-08-29 MED ORDER — LIDOCAINE VISCOUS HCL 2 % MT SOLN: OROMUCOSAL | 0 refills | Status: AC

## 2023-08-29 NOTE — Discharge Instructions (Signed)
 The rapid strep test was negative.  A throat culture has been ordered.  You will be contacted when the results of the culture are received. Take medication as prescribed. Increase fluids and allow for plenty of rest. You may continue over-the-counter Tylenol or ibuprofen as needed for pain, fever, or general discomfort. Recommend warm salt water gargles 3-4 times daily as needed for throat pain or discomfort.  Also recommend Chloraseptic throat spray or throat lozenges while symptoms persist. Warm tea with honey and lemon will also be helpful while you are throat pain persist. Discard your toothbrush after 3 days. If symptoms fail to improve with this treatment, you may follow-up in this clinic or with your primary care physician for further evaluation. Follow-up as needed.

## 2023-08-29 NOTE — ED Triage Notes (Signed)
 Pt reports sore throat and body aches for last several days. Pt reports child tested positive for strep this am.

## 2023-08-29 NOTE — ED Provider Notes (Addendum)
 [facility]    CSN: [id number] Arrival date & time: 08/29/23  1152      History   Chief Complaint Chief Complaint  Patient presents with   Sore Throat    Severe sore throat for several days. Body aches. Hard to swallow. - Entered by patient    HPI Tami Velasquez is a 33 y.o. female.   The history is provided by the patient.   Patient presents for a several day history of sore throat, body aches, and a mild cough.  Patient denies fever, chills, headache, ear pain, nasal congestion, runny nose, abdominal pain, nausea, vomiting, diarrhea, or rash.  Patient reports that she has noticed that it is difficult for her to swallow.  She also states that her child tested positive for strep earlier this morning.  So far she has been taking over-the-counter analgesics for her symptoms.  Past Medical History:  Diagnosis Date   ADHD    HSV-1 (herpes simplex virus 1) infection    Vaginal Pap smear, abnormal     Patient Active Problem List   Diagnosis Date Noted   H/O cesarean section 08/27/2021   Rh negative state in antepartum period 01/25/2021   Bilateral ovarian cysts 01/25/2021   Attention deficit hyperactivity disorder (ADHD), combined type 02/28/2014    Past Surgical History:  Procedure Laterality Date   WISDOM TOOTH EXTRACTION      OB History     Gravida  4   Para  3   Term  3   Preterm  0   AB  0   Living  4      SAB  0   IAB  0   Ectopic  0   Multiple  1   Live Births  4            Home Medications    Prior to Admission medications   Medication Sig Start Date End Date Taking? Authorizing Provider  amoxicillin (AMOXIL) 500 MG capsule Take 1 capsule (500 mg total) by mouth 2 (two) times daily for 10 days. 08/29/23 09/08/23 Yes Leath-Warren, Etta PARAS, NP  lidocaine (XYLOCAINE) 2 % solution Gargle and spit 5 mL every 6 hours as needed for throat pain or discomfort. 08/29/23  Yes Leath-Warren, Etta PARAS, NP  Docusate Sodium (COLACE PO)  Take by mouth. Patient not taking: Reported on 08/27/2021    [provider]  ferrous sulfate  325 (65 FE) MG tablet Take 1 tablet (325 mg total) by mouth every other day. Patient not taking: Reported on 08/27/2021 05/08/21   Kizzie Suzen SAUNDERS, CNM  Prenatal Vit-Fe Fumarate-FA (PRENATAL VITAMIN PO) Take by mouth.    [provider]  trimethoprim -polymyxin b  (POLYTRIM ) ophthalmic solution Place 1 drop into both eyes every 6 (six) hours. 04/01/22   Stuart Vernell Norris, PA-C    Family History Family History  Problem Relation Age of Onset   Cerebral palsy Sister    Stroke Paternal Aunt    Cancer Maternal Grandfather    Hypertension Maternal Grandfather    Heart disease Paternal Grandfather    Cancer Paternal Grandfather     Social History Social History   Tobacco Use   Smoking status: Never  Vaping Use   Vaping status: Never Used  Substance Use Topics   Alcohol use: No   Drug use: No     Allergies   Zithromax [azithromycin]   Review of Systems Review of Systems Per HPI  Physical Exam Triage Vital Signs ED Triage  Vitals  Encounter Vitals Group     BP 08/29/23 1212 115/81     Girls Systolic BP Percentile --      Girls Diastolic BP Percentile --      Boys Systolic BP Percentile --      Boys Diastolic BP Percentile --      Pulse Rate 08/29/23 1212 70     Resp 08/29/23 1212 20     Temp 08/29/23 1212 98.5 F (36.9 C)     Temp Source 08/29/23 1212 Oral     SpO2 08/29/23 1212 98 %     Weight --      Height --      Head Circumference --      Peak Flow --      Pain Score 08/29/23 1214 9     Pain Loc --      Pain Education --      Exclude from Growth Chart --    No data found.  Updated Vital Signs BP 115/81 (BP Location: Right Arm)   Pulse 70   Temp 98.5 F (36.9 C) (Oral)   Resp 20   LMP 08/10/2023   SpO2 98%   Breastfeeding No   Visual Acuity Right Eye Distance:   Left Eye Distance:   Bilateral Distance:    Right Eye Near:   Left  Eye Near:    Bilateral Near:     Physical Exam Vitals and nursing note reviewed.  Constitutional:      General: She is not in acute distress.    Appearance: She is well-developed.  HENT:     Head: Normocephalic.     Right Ear: Tympanic membrane and ear canal normal.     Left Ear: Tympanic membrane and ear canal normal.     Nose: Nose normal. No congestion.     Mouth/Throat:     Mouth: Mucous membranes are moist.     Pharynx: Pharyngeal swelling, oropharyngeal exudate and posterior oropharyngeal erythema present.     Tonsils: Tonsillar exudate present. 1+ on the right. 1+ on the left.  Eyes:     Conjunctiva/sclera: Conjunctivae normal.     Pupils: Pupils are equal, round, and reactive to light.  Cardiovascular:     Rate and Rhythm: Normal rate and regular rhythm.     Heart sounds: Normal heart sounds.  Pulmonary:     Effort: Pulmonary effort is normal. No respiratory distress.     Breath sounds: Normal breath sounds. No stridor. No wheezing, rhonchi or rales.  Abdominal:     General: Bowel sounds are normal.     Palpations: Abdomen is soft.     Tenderness: There is no abdominal tenderness.  Musculoskeletal:     Cervical back: Normal range of motion.  Lymphadenopathy:     Cervical: No cervical adenopathy.  Skin:    General: Skin is warm and dry.  Neurological:     General: No focal deficit present.     Mental Status: She is alert and oriented to person, place, and time.  Psychiatric:        Mood and Affect: Mood normal.        Behavior: Behavior normal.      UC Treatments / Results  Labs (all labs ordered are listed, but only abnormal results are displayed) Labs Reviewed  POCT RAPID STREP A (OFFICE) - Normal  CULTURE, GROUP A STREP [facility])    EKG   Radiology No results found.  Procedures Procedures (including critical care time)  Medications Ordered in UC Medications - No data to display  Initial Impression / Assessment and Plan / UC Course  I have  reviewed the triage vital signs and the nursing notes.  Pertinent labs & imaging results that were available during my care of the patient were reviewed by me and considered in my medical decision making (see chart for details).  On exam, patient with moderate exudate on both tonsils, +1 tonsil swelling bilaterally.  Rapid strep test was negative.  Given patient's symptoms, and exam findings, will treat empirically for acute tonsillitis with amoxicillin 500 mg.  Will also treat with viscous lidocaine 2% for patient to gargle and spit for throat pain or discomfort.  Supportive care recommendations were provided discussed with the patient to include fluids, rest, warm salt water gargles, and use of Chloraseptic throat spray.  Patient advised to discard toothbrush after 3 days.  Discussed indications with patient regarding follow-up.  Patient was in agreement with this plan of care and verbalizes understanding.  All questions were answered.  Patient stable for discharge.  Final Clinical Impressions(s) / UC Diagnoses   Final diagnoses:  Acute tonsillitis, unspecified etiology     Discharge Instructions      The rapid strep test was negative.  A throat culture has been ordered.  You will be contacted when the results of the culture are received. Take medication as prescribed. Increase fluids and allow for plenty of rest. You may continue over-the-counter Tylenol or ibuprofen as needed for pain, fever, or general discomfort. Recommend warm salt water gargles 3-4 times daily as needed for throat pain or discomfort.  Also recommend Chloraseptic throat spray or throat lozenges while symptoms persist. Warm tea with honey and lemon will also be helpful while you are throat pain persist. Discard your toothbrush after 3 days. If symptoms fail to improve with this treatment, you may follow-up in this clinic or with your primary care physician for further evaluation. Follow-up as needed.     ED  Prescriptions     Medication Sig Dispense Auth. Provider   amoxicillin (AMOXIL) 500 MG capsule Take 1 capsule (500 mg total) by mouth 2 (two) times daily for 10 days. 20 capsule Leath-Warren, Etta PARAS, NP   lidocaine (XYLOCAINE) 2 % solution Gargle and spit 5 mL every 6 hours as needed for throat pain or discomfort. 100 mL Leath-Warren, Etta PARAS, NP      PDMP not reviewed this encounter.   Gilmer Etta PARAS, NP 08/29/23 1229    Gilmer Etta PARAS, NP 08/29/23 1229

## 2023-09-01 ENCOUNTER — Ambulatory Visit (HOSPITAL_COMMUNITY): Payer: Self-pay

## 2023-09-01 LAB — CULTURE, GROUP A STREP (THRC)
